# Patient Record
Sex: Female | Born: 1950 | Race: White | Hispanic: No | Marital: Married
Health system: Midwestern US, Academic
[De-identification: ages and names within clinical notes are randomized; demographics above are authoritative.]

---

## 2017-04-20 ENCOUNTER — Encounter: Admit: 2017-04-20 | Discharge: 2017-04-20 | Payer: MEDICARE

## 2017-04-20 NOTE — Telephone Encounter
Orders faxed.  Markus Jarvis RN BSN

## 2017-04-20 NOTE — Telephone Encounter
Valerie Price I sched pt to see WD on 08/13 @ 10:00am, pt would like an order for labs and Abd. U/S sent to [facility] in [place], [place]. Thanks

## 2017-05-04 ENCOUNTER — Encounter: Admit: 2017-05-04 | Discharge: 2017-05-04 | Payer: MEDICARE

## 2017-05-06 ENCOUNTER — Encounter: Admit: 2017-05-06 | Discharge: 2017-05-06 | Payer: MEDICARE

## 2017-05-11 ENCOUNTER — Ambulatory Visit: Admit: 2017-05-11 | Discharge: 2017-05-12 | Payer: MEDICARE

## 2017-05-11 ENCOUNTER — Encounter: Admit: 2017-05-11 | Discharge: 2017-05-11 | Payer: MEDICARE

## 2017-05-11 DIAGNOSIS — E118 Type 2 diabetes mellitus with unspecified complications: ICD-10-CM

## 2017-05-11 DIAGNOSIS — K7581 Nonalcoholic steatohepatitis (NASH): ICD-10-CM

## 2017-05-11 DIAGNOSIS — K7469 Other cirrhosis of liver: Principal | ICD-10-CM

## 2017-05-11 LAB — PROTIME INR (PT)
Lab: 1.1 U/L (ref 5–34)
Lab: 12 s (ref >59)

## 2017-05-11 LAB — COMPREHENSIVE METABOLIC PANEL
Lab: 0.4 mg/dL (ref 0.0–1.0)
Lab: 0.8 mg/dL (ref 0.57–1.11)
Lab: 10 mg/dL (ref 8.4–10.2)
Lab: 13 mg/dL (ref 9.8–20.1)
Lab: 137 MMOL/L (ref 136–145)
Lab: 158 mg/dL — ABNORMAL HIGH (ref 80–115)
Lab: 3.8 g/dL (ref 3.4–4.8)
Lab: 30 U/L (ref 0–55)
Lab: 4.5 MMOL/L (ref 3.5–5.1)
Lab: 68 IU/L (ref 40–150)
Lab: 7.8 g/dL (ref 6.2–8.1)
Lab: 70 mL/min/{1.73_m2} (ref >59)

## 2017-05-11 LAB — HEMOGLOBIN A1C: Lab: 7.4 % — ABNORMAL HIGH (ref 4.5–6.5)

## 2017-05-11 NOTE — Progress Notes
Date of Service: 05/11/2017    Subjective:             Valerie Price is a 66 y.o. female.    History of Present Illness  66 year old lady with a liver biopsy during cholecystectomy that showed Elita Boone and cirrhosis.  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  Her additional problems includes borderline controlled diabetes.  Her hemoglobin A1c has been 7.9 back in February.  We have tried dose for 2 times and each time says she and up with terrible symptoms including a horrible yeast infection and says she eventually stopped.  Her endocrinologist at Dr. Threasa Beards is currently managing her diabetes.  In addition she developed arrhythmia symptoms and says she underwent 2 weeks of monitoring that turns out to be all right.  She also underwent stress test.  She is currently on isosorbide and she is not having any arrhythmia or angina symptoms.  Patient has some leg edema and she is not requiring any diuretics.  Patient does not have any hepatic encephalopathy.  Her last EGD was in 2016 that did not show any varices.  Her ultrasound was just done and it was normal.  Patient's brother has just passed away due to liver cancer from cirrhosis she is actually very interested in enrolling in the atlas study and she has received the consent form today.  We will contact her again she has a chance to review the consent form.     Review of Systems   All other systems reviewed and are negative.        Objective:         ??? acetaminophen (TYLENOL) 325 mg tablet Take 325 mg by mouth every 4 hours as needed.     ??? albuterol (VENTOLIN HFA, PROAIR HFA, PROVENTIL HFA) 90 mcg/actuation inhaler Inhale 2 puffs by mouth into the lungs every 6 hours as needed for Wheezing or Shortness of Breath. Shake well before use.   ??? ASCORBATE CALCIUM (VITAMIN C PO) Take 500 mg by mouth.     ??? carvedilol (COREG) 12.5 mg tablet Take 1 tablet by mouth twice daily with meals. ??? cholecalciferol (VITAMIN D-3) 1,000 units tablet Take 1,000 Units by mouth daily.   ??? Comp Stocking,Knee,Regular,Med misc 20-30   ??? cyanocobalamin(DIL) (VITAMIN B-12) 100 mcg/mL Inject  to area(s) as directed.     ??? DOCOSAHEXANOIC ACID/EPA (FISH OIL PO) Take  by mouth.   ??? glimepiride (AMARYL) 4 mg tablet Take 4 mg by mouth twice daily.     ??? insulin detemir(+) (LEVEMIR FLEXTOUCH) 100 unit/mL (3 mL) injection pen Inject  under the skin daily with dinner.   ??? Insulin Needles (Disposable) (PEN NEEDLE) 32 gauge x 5/32 ndle Use 1 Device as directed daily. Use with Victoza   ??? insulin pen needles (disposable) (PEN NEEDLE) 31 gauge x 5/16 pen needle Use 1 each as directed as Needed. Use with Victoza pen.   ??? lactobacillus rhamnosus GG (LACTOBACILLUS RHAMNOSUS (GG)) 15 billion cell cpSP capsule Take  by mouth as directed once.   ??? linagliptin 5 mg tab Take 5 mg by mouth daily.   ??? liraglutide(+) (VICTOZA) 0.6 mg/0.1 mL (18 mg/3 mL) pnij Inject 0.3 mL under the skin daily.   ??? magnesium oxide (MAG-OX) 400 mg tablet Take 400 mg by mouth daily.   ??? metformin-ER(+) (FORTAMET) 1,000 mg tablet Take 1,000 mg by mouth twice daily.     ??? MULTIVITAMIN PO Take  by mouth.     ???  omeprazole DR(+) (PRILOSEC) 40 mg capsule Take 40 mg by mouth daily before breakfast.   ??? other medication 1 Dose. Tradjenta 5 mg by mouth daily  For diabetes    ??? quinapril(+) (ACCUPRIL) 20 mg tablet Take 20 mg by mouth at bedtime daily.     ??? VITAMIN E ACETATE (VITAMIN E PO) Take  by mouth.   ??? vitamins, B complex Tab Take 1 Tab by mouth daily.       Vitals:    05/11/17 0957   BP: 160/69   Pulse: 88   Resp: 16   Temp: 36.5 ???C (97.7 ???F)   TempSrc: Oral   SpO2: 95%   Weight: 115.4 kg (254 lb 6.4 oz)   Height: 162.6 cm (64)     Body mass index is 43.67 kg/m???.     Physical Exam         Assessment and Plan:  66 year old lady with a liver biopsy during cholecystectomy that showed Elita Boone and cirrhosis.  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  Her additional problems includes borderline controlled diabetes.  Her hemoglobin A1c has been 7.9 back in February.  We have tried dose for 2 times and each time says she and up with terrible symptoms including a horrible yeast infection and says she eventually stopped.  Her endocrinologist at Dr. Threasa Beards is currently managing her diabetes.  In addition she developed arrhythmia symptoms and says she underwent 2 weeks of monitoring that turns out to be all right.  She also underwent stress test.  She is currently on isosorbide and she is not having any arrhythmia or angina symptoms.  Patient has some leg edema and she is not requiring any diuretics.  Patient does not have any hepatic encephalopathy.  Her last EGD was in 2016 that did not show any varices.  Her ultrasound was just done and it was normal.  Patient's brother has just passed away due to liver cancer from cirrhosis she is actually very interested in enrolling in the atlas study and she has received the consent form today.  We will contact her again she has a chance to review the consent form.

## 2017-05-12 DIAGNOSIS — E118 Type 2 diabetes mellitus with unspecified complications: ICD-10-CM

## 2017-05-12 DIAGNOSIS — K7581 Nonalcoholic steatohepatitis (NASH): Principal | ICD-10-CM

## 2017-05-12 DIAGNOSIS — K7469 Other cirrhosis of liver: ICD-10-CM

## 2017-05-12 DIAGNOSIS — Z794 Long term (current) use of insulin: ICD-10-CM

## 2017-05-12 NOTE — Progress Notes
Results discussed with patient in LOV 05/11/17 per Dr Idolina Primer.  Markus Jarvis RN BSN

## 2017-05-19 ENCOUNTER — Encounter: Admit: 2017-05-19 | Discharge: 2017-05-19 | Payer: MEDICARE

## 2017-05-19 DIAGNOSIS — I10 Essential (primary) hypertension: Principal | ICD-10-CM

## 2017-05-19 MED ORDER — CARVEDILOL 12.5 MG PO TAB
12.5 mg | ORAL_TABLET | Freq: Two times a day (BID) | ORAL | 3 refills | 90.00000 days | Status: AC
Start: 2017-05-19 — End: 2018-05-03

## 2017-05-19 NOTE — Telephone Encounter
Prescription refill request from [facility] for coreg.   Last refill was 05/21/16 (#180 x 3).   Last visit was 05/11/17 and next visit is 05/28/18.   Medication refilled per verbal order by Dr Idolina Primer.  Markus Jarvis RN BSN

## 2017-10-20 ENCOUNTER — Encounter: Admit: 2017-10-20 | Discharge: 2017-10-21 | Payer: MEDICARE

## 2017-10-20 DIAGNOSIS — R69 Illness, unspecified: Principal | ICD-10-CM

## 2017-10-21 ENCOUNTER — Encounter: Admit: 2017-10-21 | Discharge: 2017-10-21 | Payer: MEDICARE

## 2017-10-21 DIAGNOSIS — E119 Type 2 diabetes mellitus without complications: ICD-10-CM

## 2017-10-21 DIAGNOSIS — K7581 Nonalcoholic steatohepatitis (NASH): Principal | ICD-10-CM

## 2017-10-21 DIAGNOSIS — K7469 Other cirrhosis of liver: Principal | ICD-10-CM

## 2017-10-21 LAB — CBC AND DIFF
Lab: 0.1 10*3/uL — ABNORMAL LOW (ref 0.0–0.2)
Lab: 0.4 10*3/uL (ref 0.1–0.9)
Lab: 1.7 10*3/uL — ABNORMAL HIGH (ref 0.9–5.1)
Lab: 125 x10-3/uL — ABNORMAL LOW (ref 130–400)
Lab: 13 g/dL (ref 12.0–16.0)
Lab: 15 % — ABNORMAL HIGH (ref 11.5–14.5)
Lab: 27 pg — ABNORMAL HIGH (ref 27.0–31.0)
Lab: 31 g/dL — ABNORMAL LOW (ref 33.0–37.0)
Lab: 44 % — ABNORMAL HIGH (ref 37.0–47.0)
Lab: 5.4 x10-3/uL — ABNORMAL LOW (ref 4.8–10.8)
Lab: 89 fL — ABNORMAL HIGH (ref 80.0–99.0)

## 2017-10-21 LAB — COMPREHENSIVE METABOLIC PANEL
Lab: 136 (ref 136–145)
Lab: 99
Lab: 0.4 mg/dL (ref 0.0–1.0)
Lab: 0.8 mg/dL (ref 0.57–1.11)
Lab: 10 mg/dL (ref 8.4–10.2)
Lab: 11 mg/dL (ref 9.8–20.1)
Lab: 15 meq/L — ABNORMAL HIGH (ref 0.35–4.94)
Lab: 176 mg/dL — ABNORMAL HIGH (ref 80–115)
Lab: 23 U/L (ref 15–34)
Lab: 27 MMOL/L (ref 23–31)
Lab: 31 U/L (ref 0–55)
Lab: 4.9 MMOL/L (ref 3.5–5.1)
Lab: 7.8 g/dL (ref 6.2–8.1)
Lab: 81 IU/L (ref 40–150)

## 2017-10-21 LAB — PROTIME INR (PT)
Lab: 1.1
Lab: 11 s (ref 9.9–12.1)

## 2017-10-23 ENCOUNTER — Encounter: Admit: 2017-10-23 | Discharge: 2017-10-23 | Payer: MEDICARE

## 2017-10-23 DIAGNOSIS — E119 Type 2 diabetes mellitus without complications: ICD-10-CM

## 2017-10-23 DIAGNOSIS — K7581 Nonalcoholic steatohepatitis (NASH): ICD-10-CM

## 2017-10-23 DIAGNOSIS — K7460 Unspecified cirrhosis of liver: Principal | ICD-10-CM

## 2017-10-23 LAB — CBC AND DIFF
Lab: 5.7 — ABNORMAL HIGH (ref 4.20–5.40)
Lab: 122 — ABNORMAL LOW (ref 130–400)
Lab: 16 g/dL — ABNORMAL HIGH (ref 12.0–16.0)
Lab: 5.4 10*3/uL (ref 4.8–10.6)
Lab: 53 — ABNORMAL HIGH (ref 37.0–47.0)
Lab: 92 FL (ref 80.0–99.0)

## 2017-10-23 LAB — ALPHA FETO PROTEIN (AFP): Lab: 2.2 ng/mL

## 2017-10-23 LAB — HEMOGLOBIN A1C: Lab: 7.9 — ABNORMAL HIGH (ref 4.5–6.5)

## 2017-11-03 ENCOUNTER — Encounter: Admit: 2017-11-03 | Discharge: 2017-11-03 | Payer: MEDICARE

## 2017-11-11 ENCOUNTER — Encounter: Admit: 2017-11-11 | Discharge: 2017-11-11 | Payer: MEDICARE

## 2017-11-13 ENCOUNTER — Encounter: Admit: 2017-11-13 | Discharge: 2017-11-13 | Payer: MEDICARE

## 2017-11-13 DIAGNOSIS — R69 Illness, unspecified: Principal | ICD-10-CM

## 2017-11-17 ENCOUNTER — Encounter: Admit: 2017-11-17 | Discharge: 2017-11-17 | Payer: MEDICARE

## 2017-11-17 ENCOUNTER — Encounter: Admit: 2017-11-17 | Discharge: 2017-11-18 | Payer: MEDICARE

## 2017-11-17 DIAGNOSIS — K7581 Nonalcoholic steatohepatitis (NASH): ICD-10-CM

## 2017-11-17 DIAGNOSIS — I10 Essential (primary) hypertension: ICD-10-CM

## 2017-11-17 DIAGNOSIS — M1990 Unspecified osteoarthritis, unspecified site: ICD-10-CM

## 2017-11-17 DIAGNOSIS — C541 Malignant neoplasm of endometrium: Principal | ICD-10-CM

## 2017-11-17 DIAGNOSIS — Z6841 Body Mass Index (BMI) 40.0 and over, adult: ICD-10-CM

## 2017-11-17 DIAGNOSIS — E785 Hyperlipidemia, unspecified: ICD-10-CM

## 2017-11-17 DIAGNOSIS — Z794 Long term (current) use of insulin: ICD-10-CM

## 2017-11-17 DIAGNOSIS — K7460 Unspecified cirrhosis of liver: ICD-10-CM

## 2017-11-17 DIAGNOSIS — N393 Stress incontinence (female) (male): ICD-10-CM

## 2017-11-17 DIAGNOSIS — Z87891 Personal history of nicotine dependence: ICD-10-CM

## 2017-11-17 DIAGNOSIS — E669 Obesity, unspecified: ICD-10-CM

## 2017-11-17 DIAGNOSIS — E119 Type 2 diabetes mellitus without complications: ICD-10-CM

## 2017-11-17 DIAGNOSIS — I519 Heart disease, unspecified: ICD-10-CM

## 2017-11-17 DIAGNOSIS — H9190 Unspecified hearing loss, unspecified ear: ICD-10-CM

## 2017-11-17 DIAGNOSIS — M549 Dorsalgia, unspecified: ICD-10-CM

## 2017-11-17 LAB — CBC AND DIFF
Lab: 0 % (ref >60)
Lab: 0 10*3/uL (ref 0–0.20)
Lab: 0.1 10*3/uL (ref 0–0.45)
Lab: 0.5 10*3/uL (ref 0–0.80)
Lab: 1 % (ref >60)
Lab: 1.7 10*3/uL (ref 1.0–4.8)
Lab: 10 FL (ref 7–11)
Lab: 12 g/dL (ref 12.0–15.0)
Lab: 137 10*3/uL — ABNORMAL LOW (ref 150–400)
Lab: 15 % (ref 11–15)
Lab: 25 % (ref 24–44)
Lab: 29 pg (ref 26–34)
Lab: 33 g/dL (ref 32.0–36.0)
Lab: 38 % (ref 36–45)
Lab: 4.4 M/UL — ABNORMAL HIGH (ref 4.0–5.0)
Lab: 4.7 10*3/uL (ref 1.8–7.0)
Lab: 67 % (ref 41–77)
Lab: 7 % (ref 4–12)
Lab: 7 K/UL (ref 4.5–11.0)
Lab: 88 FL (ref 80–100)

## 2017-11-17 LAB — COMPREHENSIVE METABOLIC PANEL
Lab: 135 MMOL/L — ABNORMAL LOW (ref 137–147)
Lab: 4.6 MMOL/L (ref 3.5–5.1)

## 2017-11-17 MED ORDER — METRONIDAZOLE (NON-STD) IVPB
1 g | Freq: Once | INTRAVENOUS | 0 refills | Status: CN
Start: 2017-11-17 — End: ?

## 2017-11-17 MED ORDER — LEVOFLOXACIN IN D5W 500 MG/100 ML IV PGBK
500 mg | Freq: Once | INTRAVENOUS | 0 refills | Status: CN
Start: 2017-11-17 — End: ?

## 2017-11-18 ENCOUNTER — Encounter: Admit: 2017-11-18 | Discharge: 2017-11-18 | Payer: MEDICARE

## 2017-11-18 DIAGNOSIS — K7581 Nonalcoholic steatohepatitis (NASH): ICD-10-CM

## 2017-11-18 DIAGNOSIS — I10 Essential (primary) hypertension: Principal | ICD-10-CM

## 2017-11-18 DIAGNOSIS — E785 Hyperlipidemia, unspecified: ICD-10-CM

## 2017-11-18 DIAGNOSIS — E669 Obesity, unspecified: ICD-10-CM

## 2017-11-18 DIAGNOSIS — H9190 Unspecified hearing loss, unspecified ear: ICD-10-CM

## 2017-11-18 DIAGNOSIS — N393 Stress incontinence (female) (male): ICD-10-CM

## 2017-11-18 DIAGNOSIS — I519 Heart disease, unspecified: ICD-10-CM

## 2017-11-18 DIAGNOSIS — K7460 Unspecified cirrhosis of liver: ICD-10-CM

## 2017-11-18 DIAGNOSIS — M1990 Unspecified osteoarthritis, unspecified site: ICD-10-CM

## 2017-11-18 DIAGNOSIS — M549 Dorsalgia, unspecified: ICD-10-CM

## 2017-11-18 DIAGNOSIS — E119 Type 2 diabetes mellitus without complications: ICD-10-CM

## 2017-11-19 ENCOUNTER — Encounter: Admit: 2017-11-19 | Discharge: 2017-11-19 | Payer: MEDICARE

## 2017-12-07 ENCOUNTER — Encounter: Admit: 2017-12-07 | Discharge: 2017-12-07 | Payer: MEDICARE

## 2017-12-07 ENCOUNTER — Ambulatory Visit: Admit: 2017-12-07 | Discharge: 2017-12-08 | Payer: MEDICARE

## 2017-12-07 DIAGNOSIS — K7581 Nonalcoholic steatohepatitis (NASH): ICD-10-CM

## 2017-12-07 DIAGNOSIS — K7460 Unspecified cirrhosis of liver: ICD-10-CM

## 2017-12-07 DIAGNOSIS — E785 Hyperlipidemia, unspecified: ICD-10-CM

## 2017-12-07 DIAGNOSIS — E669 Obesity, unspecified: ICD-10-CM

## 2017-12-07 DIAGNOSIS — I10 Essential (primary) hypertension: Principal | ICD-10-CM

## 2017-12-07 DIAGNOSIS — C541 Malignant neoplasm of endometrium: Principal | ICD-10-CM

## 2017-12-07 DIAGNOSIS — E538 Deficiency of other specified B group vitamins: ICD-10-CM

## 2017-12-07 DIAGNOSIS — E0590 Thyrotoxicosis, unspecified without thyrotoxic crisis or storm: ICD-10-CM

## 2017-12-07 DIAGNOSIS — E119 Type 2 diabetes mellitus without complications: ICD-10-CM

## 2017-12-07 DIAGNOSIS — H9190 Unspecified hearing loss, unspecified ear: ICD-10-CM

## 2017-12-07 DIAGNOSIS — M549 Dorsalgia, unspecified: ICD-10-CM

## 2017-12-07 DIAGNOSIS — M1990 Unspecified osteoarthritis, unspecified site: ICD-10-CM

## 2017-12-07 DIAGNOSIS — K219 Gastro-esophageal reflux disease without esophagitis: ICD-10-CM

## 2017-12-07 DIAGNOSIS — N393 Stress incontinence (female) (male): ICD-10-CM

## 2017-12-07 DIAGNOSIS — I519 Heart disease, unspecified: ICD-10-CM

## 2017-12-07 DIAGNOSIS — G4733 Obstructive sleep apnea (adult) (pediatric): ICD-10-CM

## 2017-12-07 DIAGNOSIS — R112 Nausea with vomiting, unspecified: ICD-10-CM

## 2017-12-08 DIAGNOSIS — Z01818 Encounter for other preprocedural examination: Principal | ICD-10-CM

## 2017-12-08 DIAGNOSIS — C541 Malignant neoplasm of endometrium: ICD-10-CM

## 2017-12-18 ENCOUNTER — Encounter: Admit: 2017-12-18 | Discharge: 2017-12-18 | Payer: MEDICARE

## 2017-12-21 ENCOUNTER — Encounter: Admit: 2017-12-21 | Discharge: 2017-12-21 | Payer: MEDICARE

## 2017-12-21 DIAGNOSIS — E0590 Thyrotoxicosis, unspecified without thyrotoxic crisis or storm: ICD-10-CM

## 2017-12-21 DIAGNOSIS — G4733 Obstructive sleep apnea (adult) (pediatric): ICD-10-CM

## 2017-12-21 DIAGNOSIS — K219 Gastro-esophageal reflux disease without esophagitis: ICD-10-CM

## 2017-12-21 DIAGNOSIS — E538 Deficiency of other specified B group vitamins: ICD-10-CM

## 2017-12-21 DIAGNOSIS — C541 Malignant neoplasm of endometrium: ICD-10-CM

## 2017-12-21 DIAGNOSIS — E669 Obesity, unspecified: ICD-10-CM

## 2017-12-21 DIAGNOSIS — H9190 Unspecified hearing loss, unspecified ear: ICD-10-CM

## 2017-12-21 DIAGNOSIS — K7460 Unspecified cirrhosis of liver: ICD-10-CM

## 2017-12-21 DIAGNOSIS — I10 Essential (primary) hypertension: Principal | ICD-10-CM

## 2017-12-21 DIAGNOSIS — M549 Dorsalgia, unspecified: ICD-10-CM

## 2017-12-21 DIAGNOSIS — I519 Heart disease, unspecified: ICD-10-CM

## 2017-12-21 DIAGNOSIS — E785 Hyperlipidemia, unspecified: ICD-10-CM

## 2017-12-21 DIAGNOSIS — E119 Type 2 diabetes mellitus without complications: ICD-10-CM

## 2017-12-21 DIAGNOSIS — R112 Nausea with vomiting, unspecified: ICD-10-CM

## 2017-12-21 DIAGNOSIS — M1990 Unspecified osteoarthritis, unspecified site: ICD-10-CM

## 2017-12-21 DIAGNOSIS — N393 Stress incontinence (female) (male): ICD-10-CM

## 2017-12-21 DIAGNOSIS — K7581 Nonalcoholic steatohepatitis (NASH): ICD-10-CM

## 2017-12-21 LAB — POC GLUCOSE
Lab: 180 mg/dL — ABNORMAL HIGH (ref 70–100)
Lab: 225 mg/dL — ABNORMAL HIGH (ref 70–100)
Lab: 264 mg/dL — ABNORMAL HIGH (ref 70–100)
Lab: 230 mg/dL — ABNORMAL HIGH (ref 70–100)
Lab: 224 mg/dL — ABNORMAL HIGH (ref 70–100)
Lab: 190 mg/dL — ABNORMAL HIGH (ref 70–100)

## 2017-12-21 MED ORDER — ELECTROLYTE-A IV SOLP
0 refills | Status: DC
Start: 2017-12-21 — End: 2017-12-21
  Administered 2017-12-21: 13:00:00 via INTRAVENOUS

## 2017-12-21 MED ORDER — HALOPERIDOL LACTATE 5 MG/ML IJ SOLN
1 mg | Freq: Once | INTRAVENOUS | 0 refills | Status: CP | PRN
Start: 2017-12-21 — End: ?
  Administered 2017-12-21: 16:00:00 1 mg via INTRAVENOUS

## 2017-12-21 MED ORDER — MIDAZOLAM 1 MG/ML IJ SOLN
INTRAVENOUS | 0 refills | Status: DC
Start: 2017-12-21 — End: 2017-12-21
  Administered 2017-12-21: 12:00:00 1 mg via INTRAVENOUS

## 2017-12-21 MED ORDER — HYDRALAZINE 20 MG/ML IJ SOLN
10 mg | Freq: Once | INTRAVENOUS | 0 refills | Status: CP
Start: 2017-12-21 — End: ?
  Administered 2017-12-21: 22:00:00 10 mg via INTRAVENOUS

## 2017-12-21 MED ORDER — INSULIN ASPART 100 UNIT/ML SC FLEXPEN
5 [IU] | Freq: Once | SUBCUTANEOUS | 0 refills | Status: CP
Start: 2017-12-21 — End: ?

## 2017-12-21 MED ORDER — INSULIN ASPART 100 UNIT/ML SC FLEXPEN
0-6 [IU] | Freq: Before meals | SUBCUTANEOUS | 0 refills | Status: DC
Start: 2017-12-21 — End: 2017-12-21
  Administered 2017-12-21: 17:00:00 2 [IU] via SUBCUTANEOUS

## 2017-12-21 MED ORDER — HEPARIN, PORCINE (PF) 5,000 UNIT/0.5 ML IJ SYRG
0 refills | Status: DC
Start: 2017-12-21 — End: 2017-12-21
  Administered 2017-12-21: 13:00:00 5000 [IU] via SUBCUTANEOUS

## 2017-12-21 MED ORDER — CARVEDILOL 12.5 MG PO TAB
12.5 mg | Freq: Two times a day (BID) | ORAL | 0 refills | Status: DC
Start: 2017-12-21 — End: 2017-12-23
  Administered 2017-12-22 (×3): 12.5 mg via ORAL

## 2017-12-21 MED ORDER — HYDRALAZINE 20 MG/ML IJ SOLN
10 mg | Freq: Once | INTRAVENOUS | 0 refills | Status: CP
Start: 2017-12-21 — End: ?
  Administered 2017-12-21: 10 mg via INTRAVENOUS

## 2017-12-21 MED ORDER — PROCHLORPERAZINE EDISYLATE 5 MG/ML IJ SOLN
10 mg | INTRAVENOUS | 0 refills | Status: DC | PRN
Start: 2017-12-21 — End: 2017-12-23
  Administered 2017-12-22: 03:00:00 10 mg via INTRAVENOUS

## 2017-12-21 MED ORDER — INDIGOTINDISULFONATE SODIUM 8 MG/ML (0.8 %) IJ SOLN
0 refills | Status: DC
Start: 2017-12-21 — End: 2017-12-21
  Administered 2017-12-21: 15:00:00 5 mL via INTRAVENOUS

## 2017-12-21 MED ORDER — FENTANYL CITRATE (PF) 50 MCG/ML IJ SOLN
50 ug | INTRAVENOUS | 0 refills | Status: DC | PRN
Start: 2017-12-21 — End: 2017-12-21

## 2017-12-21 MED ORDER — FENTANYL CITRATE (PF) 50 MCG/ML IJ SOLN
25 ug | INTRAVENOUS | 0 refills | Status: DC | PRN
Start: 2017-12-21 — End: 2017-12-21
  Administered 2017-12-21 (×2): 25 ug via INTRAVENOUS

## 2017-12-21 MED ORDER — PHENYLEPHRINE IN 0.9% NACL(PF) 1 MG/10 ML (100 MCG/ML) IV SYRG
INTRAVENOUS | 0 refills | Status: DC
Start: 2017-12-21 — End: 2017-12-21
  Administered 2017-12-21: 13:00:00 200 ug via INTRAVENOUS
  Administered 2017-12-21 (×3): 100 ug via INTRAVENOUS

## 2017-12-21 MED ORDER — FENTANYL CITRATE (PF) 50 MCG/ML IJ SOLN
0 refills | Status: DC
Start: 2017-12-21 — End: 2017-12-21
  Administered 2017-12-21 (×3): 50 ug via INTRAVENOUS

## 2017-12-21 MED ORDER — INSULIN GLARGINE 100 UNIT/ML (3 ML) SC INJ PEN
60 [IU] | Freq: Every evening | SUBCUTANEOUS | 0 refills | Status: DC
Start: 2017-12-21 — End: 2017-12-23
  Administered 2017-12-22: 02:00:00 60 [IU] via SUBCUTANEOUS

## 2017-12-21 MED ORDER — SUGAMMADEX 100 MG/ML IV SOLN
INTRAVENOUS | 0 refills | Status: DC
Start: 2017-12-21 — End: 2017-12-21
  Administered 2017-12-21: 15:00:00 234 mg via INTRAVENOUS

## 2017-12-21 MED ORDER — INDOCYANINE GREEN 25 MG IJ SOLR
0 refills | Status: DC
Start: 2017-12-21 — End: 2017-12-21
  Administered 2017-12-21: 14:00:00 3 mL via INTRAMUSCULAR

## 2017-12-21 MED ORDER — HYDRALAZINE 20 MG/ML IJ SOLN
5 mg | Freq: Once | INTRAVENOUS | 0 refills | Status: CP
Start: 2017-12-21 — End: ?
  Administered 2017-12-21: 21:00:00 5 mg via INTRAVENOUS

## 2017-12-21 MED ORDER — KETOROLAC 15 MG/ML IJ SOLN
15 mg | INTRAVENOUS | 0 refills | Status: CP
Start: 2017-12-21 — End: ?
  Administered 2017-12-21 – 2017-12-22 (×4): 15 mg via INTRAVENOUS

## 2017-12-21 MED ORDER — FAMOTIDINE 20 MG PO TAB
20 mg | Freq: Two times a day (BID) | ORAL | 0 refills | Status: DC
Start: 2017-12-21 — End: 2017-12-23
  Administered 2017-12-22 (×2): 20 mg via ORAL

## 2017-12-21 MED ORDER — LEVOFLOXACIN IN D5W 500 MG/100 ML IV PGBK
500 mg | Freq: Once | INTRAVENOUS | 0 refills | Status: CP
Start: 2017-12-21 — End: ?
  Administered 2017-12-21: 13:00:00 500 mg via INTRAVENOUS

## 2017-12-21 MED ORDER — ACETAMINOPHEN 500 MG PO TAB
1000 mg | Freq: Once | ORAL | 0 refills | Status: CP
Start: 2017-12-21 — End: ?
  Administered 2017-12-21: 12:00:00 1000 mg via ORAL

## 2017-12-21 MED ORDER — ONDANSETRON HCL (PF) 4 MG/2 ML IJ SOLN
4 mg | Freq: Once | INTRAVENOUS | 0 refills | Status: DC | PRN
Start: 2017-12-21 — End: 2017-12-21

## 2017-12-21 MED ORDER — LIDOCAINE (PF) 10 MG/ML (1 %) IJ SOLN
.1-2 mL | INTRAMUSCULAR | 0 refills | Status: DC | PRN
Start: 2017-12-21 — End: 2017-12-23

## 2017-12-21 MED ORDER — ONDANSETRON HCL (PF) 4 MG/2 ML IJ SOLN
4 mg | INTRAVENOUS | 0 refills | Status: DC | PRN
Start: 2017-12-21 — End: 2017-12-23
  Administered 2017-12-22 (×2): 4 mg via INTRAVENOUS

## 2017-12-21 MED ORDER — DEXTRAN 70-HYPROMELLOSE (PF) 0.1-0.3 % OP DPET
0 refills | Status: DC
Start: 2017-12-21 — End: 2017-12-21
  Administered 2017-12-21: 13:00:00 2 [drp] via OPHTHALMIC

## 2017-12-21 MED ORDER — DEXAMETHASONE SODIUM PHOSPHATE 4 MG/ML IJ SOLN
INTRAVENOUS | 0 refills | Status: DC
Start: 2017-12-21 — End: 2017-12-21
  Administered 2017-12-21: 13:00:00 4 mg via INTRAVENOUS

## 2017-12-21 MED ORDER — SODIUM CHLORIDE 0.9 % IV SOLP
1000 mL | INTRAVENOUS | 0 refills | Status: DC
Start: 2017-12-21 — End: 2017-12-22

## 2017-12-21 MED ORDER — ISOSORBIDE MONONITRATE 30 MG PO TB24
30 mg | Freq: Every morning | ORAL | 0 refills | Status: DC
Start: 2017-12-21 — End: 2017-12-23
  Administered 2017-12-22 (×2): 30 mg via ORAL

## 2017-12-21 MED ORDER — ISOSORBIDE MONONITRATE 30 MG PO TB24
30 mg | Freq: Every morning | ORAL | 0 refills | Status: DC
Start: 2017-12-21 — End: 2017-12-22

## 2017-12-21 MED ORDER — PHENYLEPHRINE IV DRIP (STD CONC)
0 refills | Status: DC
Start: 2017-12-21 — End: 2017-12-21
  Administered 2017-12-21 (×2): 0.5 ug/kg/min via INTRAVENOUS

## 2017-12-21 MED ORDER — ONDANSETRON HCL (PF) 4 MG/2 ML IJ SOLN
INTRAVENOUS | 0 refills | Status: DC
Start: 2017-12-21 — End: 2017-12-21
  Administered 2017-12-21: 15:00:00 4 mg via INTRAVENOUS

## 2017-12-21 MED ORDER — LABETALOL 5 MG/ML IV SYRG
20 mg | Freq: Once | INTRAVENOUS | 0 refills | Status: CP
Start: 2017-12-21 — End: ?
  Administered 2017-12-22: 02:00:00 20 mg via INTRAVENOUS

## 2017-12-21 MED ORDER — ACETAMINOPHEN 500 MG PO TAB
1000 mg | Freq: Once | ORAL | 0 refills | Status: CP
Start: 2017-12-21 — End: ?
  Administered 2017-12-21: 18:00:00 1000 mg via ORAL

## 2017-12-21 MED ORDER — GLIMEPIRIDE 4 MG PO TAB
4 mg | Freq: Every day | ORAL | 0 refills | Status: DC
Start: 2017-12-21 — End: 2017-12-23
  Administered 2017-12-22: 14:00:00 4 mg via ORAL

## 2017-12-21 MED ORDER — PROPOFOL INJ 10 MG/ML IV VIAL
0 refills | Status: DC
Start: 2017-12-21 — End: 2017-12-21
  Administered 2017-12-21: 13:00:00 200 mg via INTRAVENOUS

## 2017-12-21 MED ORDER — INSULIN DETEMIR U-100 100 UNIT/ML SC SOLN
60 [IU] | Freq: Every evening | SUBCUTANEOUS | 0 refills | Status: DC
Start: 2017-12-21 — End: 2017-12-22

## 2017-12-21 MED ORDER — OXYCODONE 5 MG PO TAB
10 mg | Freq: Once | ORAL | 0 refills | Status: AC
Start: 2017-12-21 — End: ?

## 2017-12-21 MED ORDER — SENNOSIDES-DOCUSATE SODIUM 8.6-50 MG PO TAB
1 | Freq: Two times a day (BID) | ORAL | 0 refills | Status: DC
Start: 2017-12-21 — End: 2017-12-23
  Administered 2017-12-22 (×2): 1 via ORAL

## 2017-12-21 MED ORDER — METRONIDAZOLE IN NACL (ISO-OS) 500 MG/100 ML IV PGBK
1 g | Freq: Once | INTRAVENOUS | 0 refills | Status: CP
Start: 2017-12-21 — End: ?
  Administered 2017-12-21: 13:00:00 1 g via INTRAVENOUS

## 2017-12-21 MED ORDER — INSULIN ASPART 100 UNIT/ML SC FLEXPEN
0-12 [IU] | Freq: Before meals | SUBCUTANEOUS | 0 refills | Status: DC
Start: 2017-12-21 — End: 2017-12-22
  Administered 2017-12-22: 02:00:00 0 [IU] via SUBCUTANEOUS

## 2017-12-21 MED ORDER — OXYCODONE 5 MG PO TAB
5-10 mg | ORAL | 0 refills | Status: DC | PRN
Start: 2017-12-21 — End: 2017-12-21
  Administered 2017-12-21: 18:00:00 10 mg via ORAL

## 2017-12-21 MED ORDER — ROCURONIUM 10 MG/ML IV SOLN
INTRAVENOUS | 0 refills | Status: DC
Start: 2017-12-21 — End: 2017-12-21
  Administered 2017-12-21: 14:00:00 10 mg via INTRAVENOUS
  Administered 2017-12-21: 13:00:00 60 mg via INTRAVENOUS
  Administered 2017-12-21 (×3): 10 mg via INTRAVENOUS
  Administered 2017-12-21: 14:00:00 20 mg via INTRAVENOUS

## 2017-12-21 MED ORDER — ACETAMINOPHEN 325 MG PO TAB
650 mg | ORAL | 0 refills | Status: DC | PRN
Start: 2017-12-21 — End: 2017-12-23
  Administered 2017-12-22 (×2): 650 mg via ORAL

## 2017-12-21 MED ORDER — ALBUTEROL SULFATE 90 MCG/ACTUATION IN HFAA
2 | RESPIRATORY_TRACT | 0 refills | Status: DC | PRN
Start: 2017-12-21 — End: 2017-12-23

## 2017-12-21 MED ORDER — FOSINOPRIL 20 MG PO TAB
40 mg | Freq: Every day | ORAL | 0 refills | Status: DC
Start: 2017-12-21 — End: 2017-12-23
  Administered 2017-12-22 (×2): 40 mg via ORAL

## 2017-12-21 MED ORDER — SODIUM CHLORIDE 0.9 % IV SOLP
INTRAVENOUS | 0 refills | Status: DC
Start: 2017-12-21 — End: 2017-12-22
  Administered 2017-12-21: 17:00:00 1000 mL via INTRAVENOUS
  Administered 2017-12-22: 01:00:00 1000.000 mL via INTRAVENOUS

## 2017-12-21 MED ORDER — ALBUTEROL SULFATE 90 MCG/ACTUATION IN HFAA
2 | RESPIRATORY_TRACT | 0 refills | Status: DC | PRN
Start: 2017-12-21 — End: 2017-12-22

## 2017-12-21 MED ORDER — PROPOFOL 10 MG/ML IV EMUL (INFUSION)(AM)(OR)
0 refills | Status: DC
Start: 2017-12-21 — End: 2017-12-21
  Administered 2017-12-21: 13:00:00 20 ug/kg/min via INTRAVENOUS

## 2017-12-21 MED ORDER — LIDOCAINE (PF) 200 MG/10 ML (2 %) IJ SYRG
0 refills | Status: DC
Start: 2017-12-21 — End: 2017-12-21
  Administered 2017-12-21: 13:00:00 100 mg via INTRAVENOUS

## 2017-12-21 MED ORDER — OXYCODONE 5 MG PO TAB
5-15 mg | ORAL | 0 refills | Status: DC | PRN
Start: 2017-12-21 — End: 2017-12-23
  Administered 2017-12-21: 18:00:00 5 mg via ORAL

## 2017-12-21 MED ORDER — BUPIVACAINE 0.25 % (2.5 MG/ML) IJ SOLN
0 refills | Status: DC
Start: 2017-12-21 — End: 2017-12-21
  Administered 2017-12-21: 14:00:00 19 mL via INTRAMUSCULAR

## 2017-12-21 MED ORDER — OXYCODONE 5 MG PO TAB
5-10 mg | Freq: Once | ORAL | 0 refills | Status: DC | PRN
Start: 2017-12-21 — End: 2017-12-21

## 2017-12-21 MED ADMIN — SODIUM CHLORIDE 0.9 % IV SOLP [27838]: 1000 mL | INTRAVENOUS | @ 12:00:00 | Stop: 2017-12-21 | NDC 00338004904

## 2017-12-22 LAB — POC GLUCOSE
Lab: 206 mg/dL — ABNORMAL HIGH (ref 70–100)
Lab: 126 mg/dL — ABNORMAL HIGH (ref 70–100)
Lab: 175 mg/dL — ABNORMAL HIGH (ref 70–100)
Lab: 136 mg/dL — ABNORMAL HIGH (ref 70–100)

## 2017-12-22 LAB — HEMOGLOBIN & HEMATOCRIT: Lab: 11 g/dL — ABNORMAL LOW (ref >60)

## 2017-12-22 MED ORDER — OXYCODONE 5 MG PO TAB
5-15 mg | ORAL_TABLET | ORAL | 0 refills | 6.00000 days | Status: AC | PRN
Start: 2017-12-22 — End: 2018-06-21

## 2017-12-22 MED ORDER — METFORMIN 500 MG PO TAB
1000 mg | Freq: Two times a day (BID) | ORAL | 0 refills | Status: DC
Start: 2017-12-22 — End: 2017-12-23
  Administered 2017-12-22 (×2): 1000 mg via ORAL

## 2017-12-22 MED ORDER — INSULIN ASPART 100 UNIT/ML SC FLEXPEN
0-24 [IU] | Freq: Before meals | SUBCUTANEOUS | 0 refills | Status: DC
Start: 2017-12-22 — End: 2017-12-23

## 2017-12-22 MED ORDER — ONDANSETRON 4 MG PO TBDI
4 mg | ORAL_TABLET | ORAL | 0 refills | 8.00000 days | Status: AC | PRN
Start: 2017-12-22 — End: 2018-06-21

## 2017-12-22 MED ORDER — SENNOSIDES-DOCUSATE SODIUM 8.6-50 MG PO TAB
1 | ORAL_TABLET | Freq: Two times a day (BID) | ORAL | 0 refills | Status: AC | PRN
Start: 2017-12-22 — End: 2018-06-21

## 2017-12-22 MED ORDER — OXYCODONE 5 MG PO TAB
5-15 mg | ORAL_TABLET | ORAL | 0 refills | 6.00000 days | Status: AC | PRN
Start: 2017-12-22 — End: 2017-12-22

## 2017-12-23 ENCOUNTER — Encounter: Admit: 2017-12-21 | Discharge: 2017-12-23 | Payer: MEDICARE

## 2017-12-23 ENCOUNTER — Encounter: Admit: 2017-12-23 | Discharge: 2017-12-23 | Payer: MEDICARE

## 2017-12-23 ENCOUNTER — Encounter: Admit: 2017-12-21 | Discharge: 2017-12-21 | Payer: MEDICARE

## 2017-12-23 DIAGNOSIS — K219 Gastro-esophageal reflux disease without esophagitis: ICD-10-CM

## 2017-12-23 DIAGNOSIS — E785 Hyperlipidemia, unspecified: ICD-10-CM

## 2017-12-23 DIAGNOSIS — M1990 Unspecified osteoarthritis, unspecified site: ICD-10-CM

## 2017-12-23 DIAGNOSIS — K7460 Unspecified cirrhosis of liver: ICD-10-CM

## 2017-12-23 DIAGNOSIS — N950 Postmenopausal bleeding: ICD-10-CM

## 2017-12-23 DIAGNOSIS — C541 Malignant neoplasm of endometrium: Principal | ICD-10-CM

## 2017-12-23 DIAGNOSIS — E0590 Thyrotoxicosis, unspecified without thyrotoxic crisis or storm: ICD-10-CM

## 2017-12-23 DIAGNOSIS — N840 Polyp of corpus uteri: ICD-10-CM

## 2017-12-23 DIAGNOSIS — G4733 Obstructive sleep apnea (adult) (pediatric): ICD-10-CM

## 2017-12-23 DIAGNOSIS — I119 Hypertensive heart disease without heart failure: ICD-10-CM

## 2017-12-23 DIAGNOSIS — N393 Stress incontinence (female) (male): ICD-10-CM

## 2017-12-23 DIAGNOSIS — K7581 Nonalcoholic steatohepatitis (NASH): ICD-10-CM

## 2017-12-23 DIAGNOSIS — E119 Type 2 diabetes mellitus without complications: ICD-10-CM

## 2017-12-23 DIAGNOSIS — Z87891 Personal history of nicotine dependence: ICD-10-CM

## 2017-12-23 DIAGNOSIS — E669 Obesity, unspecified: ICD-10-CM

## 2017-12-23 DIAGNOSIS — M549 Dorsalgia, unspecified: ICD-10-CM

## 2017-12-23 DIAGNOSIS — H9190 Unspecified hearing loss, unspecified ear: ICD-10-CM

## 2017-12-23 DIAGNOSIS — R112 Nausea with vomiting, unspecified: ICD-10-CM

## 2017-12-23 DIAGNOSIS — Z794 Long term (current) use of insulin: ICD-10-CM

## 2017-12-23 DIAGNOSIS — E538 Deficiency of other specified B group vitamins: ICD-10-CM

## 2017-12-23 DIAGNOSIS — I10 Essential (primary) hypertension: Principal | ICD-10-CM

## 2017-12-23 DIAGNOSIS — I519 Heart disease, unspecified: ICD-10-CM

## 2017-12-25 ENCOUNTER — Encounter: Admit: 2017-12-25 | Discharge: 2017-12-25 | Payer: MEDICARE

## 2017-12-31 ENCOUNTER — Encounter: Admit: 2017-12-31 | Discharge: 2017-12-31 | Payer: MEDICARE

## 2017-12-31 DIAGNOSIS — M549 Dorsalgia, unspecified: ICD-10-CM

## 2017-12-31 DIAGNOSIS — E785 Hyperlipidemia, unspecified: ICD-10-CM

## 2017-12-31 DIAGNOSIS — M1990 Unspecified osteoarthritis, unspecified site: ICD-10-CM

## 2017-12-31 DIAGNOSIS — E119 Type 2 diabetes mellitus without complications: ICD-10-CM

## 2017-12-31 DIAGNOSIS — K7460 Unspecified cirrhosis of liver: ICD-10-CM

## 2017-12-31 DIAGNOSIS — K219 Gastro-esophageal reflux disease without esophagitis: ICD-10-CM

## 2017-12-31 DIAGNOSIS — I10 Essential (primary) hypertension: Principal | ICD-10-CM

## 2017-12-31 DIAGNOSIS — E669 Obesity, unspecified: ICD-10-CM

## 2017-12-31 DIAGNOSIS — N393 Stress incontinence (female) (male): ICD-10-CM

## 2017-12-31 DIAGNOSIS — R112 Nausea with vomiting, unspecified: ICD-10-CM

## 2017-12-31 DIAGNOSIS — G4733 Obstructive sleep apnea (adult) (pediatric): ICD-10-CM

## 2017-12-31 DIAGNOSIS — E538 Deficiency of other specified B group vitamins: ICD-10-CM

## 2017-12-31 DIAGNOSIS — K7581 Nonalcoholic steatohepatitis (NASH): ICD-10-CM

## 2017-12-31 DIAGNOSIS — E0590 Thyrotoxicosis, unspecified without thyrotoxic crisis or storm: ICD-10-CM

## 2017-12-31 DIAGNOSIS — I519 Heart disease, unspecified: ICD-10-CM

## 2017-12-31 DIAGNOSIS — C541 Malignant neoplasm of endometrium: ICD-10-CM

## 2017-12-31 DIAGNOSIS — H9190 Unspecified hearing loss, unspecified ear: ICD-10-CM

## 2018-01-19 ENCOUNTER — Encounter: Admit: 2018-01-19 | Discharge: 2018-01-19 | Payer: MEDICARE

## 2018-01-19 DIAGNOSIS — M549 Dorsalgia, unspecified: ICD-10-CM

## 2018-01-19 DIAGNOSIS — E538 Deficiency of other specified B group vitamins: ICD-10-CM

## 2018-01-19 DIAGNOSIS — K219 Gastro-esophageal reflux disease without esophagitis: ICD-10-CM

## 2018-01-19 DIAGNOSIS — E785 Hyperlipidemia, unspecified: ICD-10-CM

## 2018-01-19 DIAGNOSIS — R112 Nausea with vomiting, unspecified: ICD-10-CM

## 2018-01-19 DIAGNOSIS — M1990 Unspecified osteoarthritis, unspecified site: ICD-10-CM

## 2018-01-19 DIAGNOSIS — E669 Obesity, unspecified: ICD-10-CM

## 2018-01-19 DIAGNOSIS — K7581 Nonalcoholic steatohepatitis (NASH): ICD-10-CM

## 2018-01-19 DIAGNOSIS — I519 Heart disease, unspecified: ICD-10-CM

## 2018-01-19 DIAGNOSIS — I10 Essential (primary) hypertension: Principal | ICD-10-CM

## 2018-01-19 DIAGNOSIS — H9190 Unspecified hearing loss, unspecified ear: ICD-10-CM

## 2018-01-19 DIAGNOSIS — G4733 Obstructive sleep apnea (adult) (pediatric): ICD-10-CM

## 2018-01-19 DIAGNOSIS — E119 Type 2 diabetes mellitus without complications: ICD-10-CM

## 2018-01-19 DIAGNOSIS — E0590 Thyrotoxicosis, unspecified without thyrotoxic crisis or storm: ICD-10-CM

## 2018-01-19 DIAGNOSIS — K7460 Unspecified cirrhosis of liver: ICD-10-CM

## 2018-01-19 DIAGNOSIS — C541 Malignant neoplasm of endometrium: ICD-10-CM

## 2018-01-19 DIAGNOSIS — N393 Stress incontinence (female) (male): ICD-10-CM

## 2018-04-30 ENCOUNTER — Encounter: Admit: 2018-04-30 | Discharge: 2018-04-30 | Payer: MEDICARE

## 2018-04-30 DIAGNOSIS — K7460 Unspecified cirrhosis of liver: ICD-10-CM

## 2018-04-30 DIAGNOSIS — K7581 Nonalcoholic steatohepatitis (NASH): Principal | ICD-10-CM

## 2018-05-03 ENCOUNTER — Encounter: Admit: 2018-05-03 | Discharge: 2018-05-03 | Payer: MEDICARE

## 2018-05-03 DIAGNOSIS — I10 Essential (primary) hypertension: Principal | ICD-10-CM

## 2018-05-03 MED ORDER — CARVEDILOL 12.5 MG PO TAB
12.5 mg | ORAL_TABLET | Freq: Two times a day (BID) | ORAL | 3 refills | 90.00000 days | Status: AC
Start: 2018-05-03 — End: ?

## 2018-05-13 ENCOUNTER — Encounter: Admit: 2018-05-13 | Discharge: 2018-05-13 | Payer: MEDICARE

## 2018-05-13 DIAGNOSIS — K7581 Nonalcoholic steatohepatitis (NASH): Principal | ICD-10-CM

## 2018-05-13 DIAGNOSIS — K7460 Unspecified cirrhosis of liver: ICD-10-CM

## 2018-05-13 LAB — CBC AND DIFF
Lab: 0
Lab: 0.4
Lab: 4.6
Lab: 0.1
Lab: 0.9
Lab: 1 U/L
Lab: 1.8 10*3/uL
Lab: 111 uL — ABNORMAL LOW (ref 130–400)
Lab: 13 % — ABNORMAL HIGH (ref 70–105)
Lab: 13 mmol/L
Lab: 28 pg
Lab: 3
Lab: 31 %
Lab: 34 U/L
Lab: 42 %
Lab: 5.3 10*3/uL
Lab: 56 mg/dL
Lab: 7.9

## 2018-05-13 LAB — PROTIME INR (PT)
Lab: 1.2
Lab: 13 s — ABNORMAL HIGH (ref 9.9–12.1)

## 2018-05-13 LAB — COMPREHENSIVE METABOLIC PANEL: Lab: 139 mmol/L

## 2018-05-21 ENCOUNTER — Encounter: Admit: 2018-05-21 | Discharge: 2018-05-21 | Payer: MEDICARE

## 2018-05-21 DIAGNOSIS — K7460 Unspecified cirrhosis of liver: ICD-10-CM

## 2018-05-21 DIAGNOSIS — K7581 Nonalcoholic steatohepatitis (NASH): Principal | ICD-10-CM

## 2018-05-28 ENCOUNTER — Ambulatory Visit: Admit: 2018-05-28 | Discharge: 2018-05-29 | Payer: MEDICARE

## 2018-05-28 ENCOUNTER — Encounter: Admit: 2018-05-28 | Discharge: 2018-05-28 | Payer: MEDICARE

## 2018-05-28 DIAGNOSIS — I10 Essential (primary) hypertension: Principal | ICD-10-CM

## 2018-05-28 DIAGNOSIS — K7581 Nonalcoholic steatohepatitis (NASH): Principal | ICD-10-CM

## 2018-05-28 DIAGNOSIS — C541 Malignant neoplasm of endometrium: ICD-10-CM

## 2018-05-28 DIAGNOSIS — H9190 Unspecified hearing loss, unspecified ear: ICD-10-CM

## 2018-05-28 DIAGNOSIS — K7460 Unspecified cirrhosis of liver: ICD-10-CM

## 2018-05-28 DIAGNOSIS — G4733 Obstructive sleep apnea (adult) (pediatric): ICD-10-CM

## 2018-05-28 DIAGNOSIS — M549 Dorsalgia, unspecified: ICD-10-CM

## 2018-05-28 DIAGNOSIS — I519 Heart disease, unspecified: ICD-10-CM

## 2018-05-28 DIAGNOSIS — E785 Hyperlipidemia, unspecified: ICD-10-CM

## 2018-05-28 DIAGNOSIS — E119 Type 2 diabetes mellitus without complications: ICD-10-CM

## 2018-05-28 DIAGNOSIS — N393 Stress incontinence (female) (male): ICD-10-CM

## 2018-05-28 DIAGNOSIS — E0590 Thyrotoxicosis, unspecified without thyrotoxic crisis or storm: ICD-10-CM

## 2018-05-28 DIAGNOSIS — K7469 Other cirrhosis of liver: Secondary | ICD-10-CM

## 2018-05-28 DIAGNOSIS — K219 Gastro-esophageal reflux disease without esophagitis: ICD-10-CM

## 2018-05-28 DIAGNOSIS — R112 Nausea with vomiting, unspecified: ICD-10-CM

## 2018-05-28 DIAGNOSIS — E538 Deficiency of other specified B group vitamins: ICD-10-CM

## 2018-05-28 DIAGNOSIS — E669 Obesity, unspecified: ICD-10-CM

## 2018-05-28 DIAGNOSIS — M1990 Unspecified osteoarthritis, unspecified site: ICD-10-CM

## 2018-06-21 ENCOUNTER — Encounter: Admit: 2018-06-21 | Discharge: 2018-06-21 | Payer: MEDICARE

## 2018-06-21 DIAGNOSIS — G4733 Obstructive sleep apnea (adult) (pediatric): ICD-10-CM

## 2018-06-21 DIAGNOSIS — Z90710 Acquired absence of both cervix and uterus: ICD-10-CM

## 2018-06-21 DIAGNOSIS — I10 Essential (primary) hypertension: Principal | ICD-10-CM

## 2018-06-21 DIAGNOSIS — E538 Deficiency of other specified B group vitamins: ICD-10-CM

## 2018-06-21 DIAGNOSIS — E0590 Thyrotoxicosis, unspecified without thyrotoxic crisis or storm: ICD-10-CM

## 2018-06-21 DIAGNOSIS — M549 Dorsalgia, unspecified: ICD-10-CM

## 2018-06-21 DIAGNOSIS — Z08 Encounter for follow-up examination after completed treatment for malignant neoplasm: Principal | ICD-10-CM

## 2018-06-21 DIAGNOSIS — C541 Malignant neoplasm of endometrium: ICD-10-CM

## 2018-06-21 DIAGNOSIS — I519 Heart disease, unspecified: ICD-10-CM

## 2018-06-21 DIAGNOSIS — K7460 Unspecified cirrhosis of liver: ICD-10-CM

## 2018-06-21 DIAGNOSIS — Z8542 Personal history of malignant neoplasm of other parts of uterus: ICD-10-CM

## 2018-06-21 DIAGNOSIS — K7581 Nonalcoholic steatohepatitis (NASH): ICD-10-CM

## 2018-06-21 DIAGNOSIS — N393 Stress incontinence (female) (male): ICD-10-CM

## 2018-06-21 DIAGNOSIS — E669 Obesity, unspecified: ICD-10-CM

## 2018-06-21 DIAGNOSIS — E119 Type 2 diabetes mellitus without complications: ICD-10-CM

## 2018-06-21 DIAGNOSIS — E785 Hyperlipidemia, unspecified: ICD-10-CM

## 2018-06-21 DIAGNOSIS — R112 Nausea with vomiting, unspecified: ICD-10-CM

## 2018-06-21 DIAGNOSIS — H9190 Unspecified hearing loss, unspecified ear: ICD-10-CM

## 2018-06-21 DIAGNOSIS — M1990 Unspecified osteoarthritis, unspecified site: ICD-10-CM

## 2018-06-21 DIAGNOSIS — K219 Gastro-esophageal reflux disease without esophagitis: ICD-10-CM

## 2018-11-08 ENCOUNTER — Encounter: Admit: 2018-11-08 | Discharge: 2018-11-08 | Payer: MEDICARE

## 2018-11-08 DIAGNOSIS — K7460 Unspecified cirrhosis of liver: ICD-10-CM

## 2018-11-08 DIAGNOSIS — K7581 Nonalcoholic steatohepatitis (NASH): Principal | ICD-10-CM

## 2018-11-08 LAB — ALPHA FETO PROTEIN (AFP): Lab: 3.1 % — ABNORMAL HIGH (ref 11–15)

## 2018-11-08 NOTE — Progress Notes
RN reviewed, will continue to have labs every 6 months.  Edwena Felty RN, BSN

## 2018-11-26 ENCOUNTER — Encounter: Admit: 2018-11-26 | Discharge: 2018-11-26 | Payer: MEDICARE

## 2018-11-26 DIAGNOSIS — K7469 Other cirrhosis of liver: ICD-10-CM

## 2018-11-26 DIAGNOSIS — K7581 Nonalcoholic steatohepatitis (NASH): Principal | ICD-10-CM

## 2018-12-13 ENCOUNTER — Encounter: Admit: 2018-12-13 | Discharge: 2018-12-13 | Payer: MEDICARE

## 2018-12-13 NOTE — Telephone Encounter
RN called and left VM informing patient that our GynOnc team is rescheduling patients r/t Coronavirus about 4 weeks out for their safety as long as they are not having any new s/s or concerns. RN gave nurses line for patient to call back with any other question/concerns.

## 2019-03-22 ENCOUNTER — Encounter: Admit: 2019-03-22 | Discharge: 2019-03-22

## 2019-03-22 DIAGNOSIS — E538 Deficiency of other specified B group vitamins: Secondary | ICD-10-CM

## 2019-03-22 DIAGNOSIS — K7460 Unspecified cirrhosis of liver: Secondary | ICD-10-CM

## 2019-03-22 DIAGNOSIS — I519 Heart disease, unspecified: Secondary | ICD-10-CM

## 2019-03-22 DIAGNOSIS — K219 Gastro-esophageal reflux disease without esophagitis: Secondary | ICD-10-CM

## 2019-03-22 DIAGNOSIS — E0590 Thyrotoxicosis, unspecified without thyrotoxic crisis or storm: Secondary | ICD-10-CM

## 2019-03-22 DIAGNOSIS — M549 Dorsalgia, unspecified: Secondary | ICD-10-CM

## 2019-03-22 DIAGNOSIS — E119 Type 2 diabetes mellitus without complications: Secondary | ICD-10-CM

## 2019-03-22 DIAGNOSIS — Z8542 Personal history of malignant neoplasm of other parts of uterus: Secondary | ICD-10-CM

## 2019-03-22 DIAGNOSIS — H9190 Unspecified hearing loss, unspecified ear: Secondary | ICD-10-CM

## 2019-03-22 DIAGNOSIS — R112 Nausea with vomiting, unspecified: Secondary | ICD-10-CM

## 2019-03-22 DIAGNOSIS — N393 Stress incontinence (female) (male): Secondary | ICD-10-CM

## 2019-03-22 DIAGNOSIS — C541 Malignant neoplasm of endometrium: Secondary | ICD-10-CM

## 2019-03-22 DIAGNOSIS — Z08 Encounter for follow-up examination after completed treatment for malignant neoplasm: Secondary | ICD-10-CM

## 2019-03-22 DIAGNOSIS — E669 Obesity, unspecified: Secondary | ICD-10-CM

## 2019-03-22 DIAGNOSIS — E785 Hyperlipidemia, unspecified: Secondary | ICD-10-CM

## 2019-03-22 DIAGNOSIS — K7581 Nonalcoholic steatohepatitis (NASH): Secondary | ICD-10-CM

## 2019-03-22 DIAGNOSIS — M1990 Unspecified osteoarthritis, unspecified site: Secondary | ICD-10-CM

## 2019-03-22 DIAGNOSIS — G4733 Obstructive sleep apnea (adult) (pediatric): Secondary | ICD-10-CM

## 2019-03-22 DIAGNOSIS — I10 Essential (primary) hypertension: Secondary | ICD-10-CM

## 2019-03-22 NOTE — Progress Notes
Subjective     GYNECOLOGIC ONCOLOGY EVALUATION    Name:Valerie Price    Date: 03/22/19    Referring Physician: Lavone Orn    Primary Care Physician: Lavone Orn    Chief Complaint:   Chief Complaint   Patient presents with   ??? Heme/Onc Care       History of Present Illness:  Valerie Price is a 68 y.o. female     Onc Timeline    Valerie Price is a 68 y.o. female patient of Dr. Joycie Peek with Stage IA endometrial carcinoma, FIGO grade 1.  Arrives for 6 month cancer surveillance visit.      She had three vessel stents placed. She also recently had pulmonary testing that was not good and she had to stop. She denies vaginal bleeding or pelvic pain. She has constipation. She currently has a UTI but this is the first since her surgery.    Appetite good, hydrating well and energy level ok.  Denies chest pain, SOA, cough, abdominal pain/bloating, N/V, early satiety, hematuria, hematochezia, vaginal bleeding/discharge, constipation/diarrhea, dysuria, bone pain, LE edema or fever.                Endometrial adenocarcinoma (HCC)    11/17/2017 Initial Diagnosis     Patient referred to GYN on 11/02/2017 for postmenopausal bleeding, emb and pap smear. A sonogram done on 10/20/2017 demonstrated an abnormally thickened endometrium measuring 16 mm. Patient reports bleeding for the past couple months with some brownish discharge. EMB was collected and pathology showed endometrioid adenocarcinoma FIGO grade 1.      12/21/2017 Cancer Staged     Staging form: Corpus Uteri - Carcinoma And Carcinosarcoma, AJCC 8th Edition  - Clinical stage from 12/21/2017: FIGO Stage IA (cT1a, cN0(sn), cM0)      12/21/2017 Surgery     RATLH/BSO/bilateral SLN/retroperitoneal LND and cystourethroscopy per Dr. Lynnell Grain.      12/21/2017 Pathology     Endometrioid adenocarcinoma of the uterus, FIGO grade 1. DOI 4/20 mm. 0/5 LN involved  LVSI (-); MSI (-).    Estrogen Receptor (ER): ??? ??? ??? ??? ???95% ??? ??? Positive ??? ??? Progesterone Receptor (PR): ??? ??? 90% ??? ??? Positive ??? ???   Ki-67: ??? ??? ??? ??? ??? ??? ??? ??? ??? ???20%   p53: ??? ??? ??? ??? ??? ??? ??? ??? ??? ???1% ??? ??? Normal expression         Menstrual Hx               LMP: 20 years ago               Having Periods:  No               Age at first period: 11-12  ???  Pregnancy Hx               Number of pregnancies: None               Number of live births: n/a               Age of first live birth: n/a               Did you breastfeed: No                            If Yes, how long?  Oral Birth Control:  Yes               Years: 1 year               Infertility Medication:  No               Year/Med Name:   ???  Menopausal Hx               Age of last period: Late 20's               Hormone Replacement Therapy:  No               Years:     Health Maintenence:  Pap 11/02/2017-distant history of abnormal, denies any procedures  Mammo 07/2016  Colonoscopy 02/28/2015  Bone Density 06/13/2016    Past Medical History:  Medical History:   Diagnosis Date   ??? Arthritis    ??? B12 deficiency    ??? Back pain    ??? Cirrhosis (HCC) 2012    fatty liver   ??? Diabetes (HCC)     insulin dependent/oral   ??? Endometrial adenocarcinoma (HCC) 10/2017   ??? GERD (gastroesophageal reflux disease)    ??? Hearing reduced    ??? Heart disease     Stress test, 24 hour holter approximately 1 year ago   ??? Hyperlipidemia    ??? Hypertension    ??? Hyperthyroidism 2001    s/p radioactive iodine    ??? NASH (nonalcoholic steatohepatitis)    ??? Obesity    ??? OSA (obstructive sleep apnea)     does not use CPAP but has available at home   ??? PONV (postoperative nausea and vomiting)    ??? Stress incontinence        Past Surgical History:  Surgical History:   Procedure Laterality Date   ??? LIVER BIOPSY  2012   ??? HX CHOLECYSTECTOMY  2012   ??? CATARACT REMOVAL Bilateral 2015   ??? COLONOSCOPY  2016   ??? HX HYSTERECTOMY  12/08/2017   ??? ROBOT ASSISTED LAPAROSCOPIC TOTAL HYSTERECTOMY, BILATERAL SALPINGO-OOPHORECTOMY Bilateral 12/21/2017 Performed by Pernell Dupre, MD at [facility] OR   ??? IDENTIFICATION SENTINEL LYMPH NODE Bilateral 12/21/2017    Performed by Pernell Dupre, MD at [facility] OR   ??? CYSTOURETHROSCOPY N/A 12/21/2017    Performed by Pernell Dupre, MD at [facility] OR   ??? LAPAROSCOPIC RETROPERITONEAL LYMPH NODE BIOPSY - MULTIPLE Bilateral 12/21/2017    Performed by Pernell Dupre, MD at [facility] OR       Medications:    Current Outpatient Medications:   ???  albuterol (VENTOLIN HFA, PROAIR HFA, PROVENTIL HFA) 90 mcg/actuation inhaler, Inhale 2 puffs by mouth into the lungs every 6 hours as needed for Wheezing or Shortness of Breath. Shake well before use., Disp: , Rfl:   ???  ascorbic acid (VITAMIN C) 500 mg tablet, Take 500 mg by mouth daily., Disp: , Rfl:   ???  aspirin-calcium carbonate 81 mg-300 mg calcium(777 mg) tab, 1 Tab, daily, PO, 30 Tab, 0 Number of Refills, 0, Route to Pharmacy Electronically, [facility] - [place], [place], 162.6, cm, 11/25/18 7:24:00 CST, Height, 117, kg, 11/24/18 18:34:00 CST, Admission Weight, Disp: , Rfl:   ???  carvedilol (COREG) 12.5 mg tablet, Take one tablet by mouth twice daily with meals., Disp: 180 tablet, Rfl: 3  ???  cholecalciferol (VITAMIN D-3) 1,000 units tablet, Take 1,000 Units by mouth daily., Disp: , Rfl:   ???  clopiDOGrel (PLAVIX) 75 mg tablet, , Disp: , Rfl:   ???  cyanocobalamin (VITAMIN B-12, RUBRAMIN) 1,000 mcg/mL injection, Inject 1 mL into the muscle every 30 days., Disp: , Rfl:   ???  fish oil /omega-3 fatty acids (SEA-OMEGA) 340/1000 mg capsule, Take 1 capsule by mouth daily., Disp: , Rfl:   ???  furosemide (LASIX) 40 mg tablet, 1 Tab, daily, PO, 90 Tab, 3 Number of Refills, 3, Route to Pharmacy Electronically, [facility], [place], [place], TAB, 162.6, cm, 12/27/18 12:56:00 CDT, Height, 117, kg, 11/24/18 18:34:00 CST, Admission Weight, Disp: , Rfl:   ???  glimepiride (AMARYL) 4 mg tablet, Take 4 mg by mouth daily., Disp: , Rfl:   ???  insulin detemir(+) (LEVEMIR FLEXTOUCH) 100 unit/mL (3 mL) injection pen, Inject 60 Units under the skin at bedtime daily., Disp: , Rfl:   ???  isosorbide mononitrate SR (IMDUR) 30 mg tablet, Take 30 mg by mouth every morning., Disp: , Rfl:   ???  lactobacillus rhamnosus GG (LACTOBACILLUS RHAMNOSUS (GG)) 15 billion cell cpSP capsule, Take 1 capsule by mouth as directed daily., Disp: , Rfl:   ???  linagliptin 5 mg tab, Take 5 mg by mouth daily., Disp: , Rfl:   ???  magnesium oxide (MAG-OX) 400 mg tablet, Take 400 mg by mouth daily., Disp: , Rfl:   ???  metFORMIN (GLUCOPHAGE) 1,000 mg tablet, Take 1,000 mg by mouth twice daily with meals., Disp: , Rfl:   ???  metoprolol XL (TOPROL XL) 25 mg extended release tablet, Take 25 mg by mouth., Disp: , Rfl:   ???  naproxen sodium(+) (ALEVE) 220 mg tablet, Take 1 tablet by mouth twice daily as needed. Take with food., Disp: , Rfl:   ???  omeprazole DR(+) (PRILOSEC) 40 mg capsule, Take 40 mg by mouth daily before breakfast., Disp: , Rfl:   ???  pantoprazole DR (PROTONIX) 40 mg tablet, , Disp: , Rfl:   ???  PAPAYA ENZYME PO, Take 1 capsule by mouth daily as needed., Disp: , Rfl:   ???  potassium chloride (MICRO-K) 10 mEq capsule, Take 10 mEq by mouth., Disp: , Rfl:   ???  quinapril (ACCUPRIL) 40 mg tablet, Take 40 mg by mouth daily., Disp: , Rfl:   ???  rosuvastatin (CRESTOR) 20 mg tablet, , Disp: , Rfl:   ???  vitamin E 400 unit capsule, Take 400 Units by mouth daily., Disp: , Rfl:   ???  vitamins, B complex tab, Take 1 tablet by mouth daily., Disp: , Rfl:   ???  vitamins, multiple tablet, Take 1 tablet by mouth daily., Disp: , Rfl:     Allergies:  Allergies   Allergen Reactions   ??? Keflex [Cephalexin] ANAPHYLAXIS and HIVES   ??? Penicillins ANAPHYLAXIS   ??? Latex HIVES and RASH     tape after surgery, gloves worn by providers make her break out    ??? Aspirin SEE COMMENTS     Severely constipates her.       Social History:  Social History     Socioeconomic History   ??? Marital status: Married     Spouse name: Not on file   ??? Number of children: Not on file ??? Years of education: Not on file   ??? Highest education level: Not on file   Occupational History   ??? Not on file   Tobacco Use   ??? Smoking status: Former Smoker     Packs/day: 1.00     Years: 30.00  Pack years: 30.00     Types: Cigarettes     Last attempt to quit: 12/07/2008     Years since quitting: 10.2   ??? Smokeless tobacco: Never Used   Substance and Sexual Activity   ??? Alcohol use: Yes     Alcohol/week: 0.0 standard drinks     Comment: rarely   ??? Drug use: No   ??? Sexual activity: Not on file   Other Topics Concern   ??? Not on file   Social History Narrative   ??? Not on file       Family History:  Family History   Problem Relation Age of Onset   ??? Diabetes Mother    ??? Heart Disease Mother    ??? Hypertension Mother    ??? Arthritis-osteo Mother    ??? Diabetes Father    ??? Heart Disease Father    ??? Hypertension Father    ??? Thyroid Disease Father    ??? Diabetes Sister    ??? Heart Disease Sister    ??? High Cholesterol Sister    ??? Arthritis-rheumatoid Sister    ??? Migraines Sister    ??? Depression Sister    ??? Cancer Brother    ??? Diabetes Brother    ??? Hypertension Brother    ??? Arthritis-osteo Brother    ??? Arthritis-osteo Maternal Aunt    ??? Cancer-Lung Paternal Aunt    ??? Cancer Paternal Aunt    ??? Diabetes Paternal Aunt    ??? Diabetes Paternal Uncle    ??? Hypertension Paternal Uncle    ??? Cancer-Lung Maternal Grandmother    ??? Diabetes Maternal Grandmother    ??? Heart Disease Maternal Grandmother    ??? Hypertension Maternal Grandmother    ??? Arthritis-rheumatoid Maternal Grandmother    ??? Diabetes Maternal Grandfather    ??? High Cholesterol Paternal Grandmother    ??? Diabetes Paternal Grandfather    ??? Heart Disease Paternal Grandfather          REVIEW OF SYSTEMS:             CONSTITUTIONAL: per HPI  EYES: per HPI  ENT: per HPI  RESPIRATORY: per HPI  CARDIOVASCULAR: per HPI  GI: per HPI  GU: per HPI  MUSCULO-SKELETAL: per HPI  SKIN: per HPI  ENDOCRINE: per HPI  HEMATOLOGIC: per HPI    ECOG 0     Physical Exam: BP (!) 170/60 (BP Source: Arm, Left Upper, Patient Position: Sitting)  - Pulse 104  - Temp 36.1 ???C (96.9 ???F) (Temporal)  - Resp 18  - Ht 162.6 cm (64)  - Wt 115.6 kg (254 lb 12.8 oz)  - SpO2 96%  - BMI 43.74 kg/m???   GENERAL APPEARANCE: Appears healthy.  Alert; in no acute distress.  Pleasant.  HEENT: Unremarkable. No tenderness or masses noted.  NECK: Neck supple. No tenderness. No adenopathy.     LUNGS: Chest symmetrical. Good diaphragmatic excursion. Lungs clear; normal breath sounds.  CARDIOVASCULAR:  RRR. Heart sounds normal.      ABDOMEN: Abdomen soft, non-tender.  Obese. No masses, organomegaly, or hernia. No clinical evidence of ascites.  Well healing incisions.  PELVIC:   External genitalia, anus, perineum, urethral meatus, urethra, bladder and vagina normal. Vaginal cuff intact. Exam chaperoned by nurse  EXTREMITIES: Extremities normal. No joint deformities, edema, or skin discoloration. Station and gait normal.  SKIN: Skin color, texture, turgor normal. No rashes or lesions.  LYMPH NODES: No palpable supraclavicular or inguinal lymph nodes.  CBC w/Diff    Lab Results   Component Value Date/Time    WBC 5.3 11/01/2018 12:14 PM    RBC 4.65 11/01/2018 12:14 PM    HGB 13.4 11/01/2018 12:14 PM    HCT 42.3 11/01/2018 12:14 PM    MCV 90.8 11/01/2018 12:14 PM    MCH 28.8 11/01/2018 12:14 PM    MCHC 31.7 11/01/2018 12:14 PM    RDW 13.9 11/01/2018 12:14 PM    PLTCT 111 (L) 11/01/2018 12:14 PM    MPV 10.0 11/17/2017 01:12 PM    Lab Results   Component Value Date/Time    NEUT 56.1 11/01/2018 12:14 PM    ANC 3.0 11/01/2018 12:14 PM    LYMA 25 11/17/2017 01:12 PM    ALC 1.8 11/01/2018 12:14 PM    MONA 7 11/17/2017 01:12 PM    AMC 0.4 11/01/2018 12:14 PM    EOSA 1 11/17/2017 01:12 PM    AEC 0.1 11/01/2018 12:14 PM    BASA 0 11/17/2017 01:12 PM    ABC 0.0 11/01/2018 12:14 PM          Comprehensive Metabolic Profile    Lab Results   Component Value Date/Time    NA 139 11/01/2018 12:14 PM    K 4.4 11/01/2018 12:14 PM CL 102 11/01/2018 12:14 PM    CO2 27.0 11/01/2018 12:14 PM    GAP 14 11/01/2018 12:14 PM    BUN 12.0 11/01/2018 12:14 PM    CR 0.83 11/01/2018 12:14 PM    GLU 134 (H) 11/01/2018 12:14 PM    Lab Results   Component Value Date/Time    CA 9.6 11/01/2018 12:14 PM    ALBUMIN 3.9 11/01/2018 12:14 PM    TOTPROT 7.7 11/01/2018 12:14 PM    ALKPHOS 68 11/01/2018 12:14 PM    AST 22 11/01/2018 12:14 PM    ALT 24 11/01/2018 12:14 PM    TOTBILI 0.40 11/01/2018 12:14 PM    GFR 72.4 11/01/2018 12:14 PM    GFRAA >60 11/17/2017 01:12 PM          Other labs    No results found for: ESR No results found for: LDH       ASSESSMENT/PLAN:  Valerie Price is a 68 y.o. female  With Stage IA endometrial adenocarcinoma.  sp Robotic TLH BSO, sentinel lymph node biopsy, cystoscopy    Clinically NED of endometrial cancer    Multiple medical issues requiring close fu with PCP    Breast and colon health surveillance with PCP    Plan for surveillance q 6 months for first 2 years and then yearly for three years.        Burgess Estelle, MD  Gynecologic Oncology

## 2019-05-03 ENCOUNTER — Encounter: Admit: 2019-05-03 | Discharge: 2019-05-03

## 2019-05-03 DIAGNOSIS — K7581 Nonalcoholic steatohepatitis (NASH): Secondary | ICD-10-CM

## 2019-05-03 DIAGNOSIS — K7469 Other cirrhosis of liver: Secondary | ICD-10-CM

## 2019-05-03 NOTE — Telephone Encounter
Call from [facility] regarding [place] and lab orders.  Please return her call.

## 2019-05-03 NOTE — Telephone Encounter
Lab orders have been faxed.

## 2019-05-03 NOTE — Telephone Encounter
Returned call- [place] order faxed to [facility].  Awaiting NP signature for labs to be faxed.

## 2019-05-27 ENCOUNTER — Encounter: Admit: 2019-05-27 | Discharge: 2019-05-27

## 2019-05-27 DIAGNOSIS — K7469 Other cirrhosis of liver: Secondary | ICD-10-CM

## 2019-05-27 DIAGNOSIS — K7581 Nonalcoholic steatohepatitis (NASH): Secondary | ICD-10-CM

## 2019-05-30 ENCOUNTER — Encounter: Admit: 2019-05-30 | Discharge: 2019-05-30

## 2019-05-30 DIAGNOSIS — K7581 Nonalcoholic steatohepatitis (NASH): Secondary | ICD-10-CM

## 2019-05-30 DIAGNOSIS — K7469 Other cirrhosis of liver: Secondary | ICD-10-CM

## 2019-05-30 LAB — CBC AND DIFF
Lab: 0.1
Lab: 0.4
Lab: 1.7
Lab: 32 (ref 0.0–0.2)
Lab: 1.1 U/L
Lab: 1.1 g/dL
Lab: 116 mg/dL — ABNORMAL LOW (ref 130–400)
Lab: 12 mmol/L (ref 0.16–1.0)
Lab: 14 — ABNORMAL HIGH (ref 70–105)
Lab: 28 mg/dL
Lab: 3.4
Lab: 30 U/L — ABNORMAL HIGH (ref 5–34)
Lab: 4.4 (ref 8–54)
Lab: 40 meq/L — ABNORMAL HIGH (ref 0–14)
Lab: 5.6 mmol/L (ref 10–40)
Lab: 60 mg/dL
Lab: 7.4 U/L
Lab: 90 mg/dL (ref 0.0–0.8)

## 2019-05-30 LAB — LIPID PROFILE
Lab: 117
Lab: 147
Lab: 2
Lab: 29
Lab: 40
Lab: 48

## 2019-05-30 LAB — 25-OH VITAMIN D (D2 + D3): Lab: 45 K/UL (ref 150–400)

## 2019-05-30 LAB — COMPREHENSIVE METABOLIC PANEL: Lab: 137 mmol/L (ref 136–145)

## 2019-05-30 LAB — ALPHA FETO PROTEIN (AFP): Lab: 3.4 U/L (ref 7–40)

## 2019-05-30 LAB — PROTIME INR (PT): Lab: 11 s — AB (ref 3.1–5.4)

## 2019-05-30 NOTE — Progress Notes
RN reviewed. Will review results with pt during f/u appt on 06/03/2019.  Eda Keys RN, BSN

## 2019-05-30 NOTE — Telephone Encounter
Returned call to pt. Pt wants to cancel 06/03/19 OV w/[facility] due to not feeling safe coming into hospital for routine visit. Offered telehealth but pt does not have computer or smartphone for video. Offered telephone appt but pt states she would rather just cancel altogether and call back after COVID to reschedule. Pt states she had labs and imaging completed recently and PCP told her labs look normal.

## 2019-05-30 NOTE — Progress Notes
Outside [place] and labs reviewed. No evidence of hepatoma. Stable AFP. Plan to repeat [place] screening again in 6 months. Spoke to pt on the phone and she is agreeable.

## 2019-05-30 NOTE — Telephone Encounter
Please return a call to Valerie Price regarding cancellation of her 06/03/19 appointment with Dr. Idolina Primer.

## 2019-05-31 ENCOUNTER — Encounter: Admit: 2019-05-31 | Discharge: 2019-05-31

## 2019-05-31 DIAGNOSIS — K7581 Nonalcoholic steatohepatitis (NASH): Secondary | ICD-10-CM

## 2019-05-31 DIAGNOSIS — K7469 Other cirrhosis of liver: Secondary | ICD-10-CM

## 2019-10-13 ENCOUNTER — Encounter: Admit: 2019-10-13 | Discharge: 2019-10-13 | Payer: MEDICARE

## 2019-10-13 DIAGNOSIS — M549 Dorsalgia, unspecified: Secondary | ICD-10-CM

## 2019-10-13 DIAGNOSIS — H9190 Unspecified hearing loss, unspecified ear: Secondary | ICD-10-CM

## 2019-10-13 DIAGNOSIS — I519 Heart disease, unspecified: Secondary | ICD-10-CM

## 2019-10-13 DIAGNOSIS — C541 Malignant neoplasm of endometrium: Secondary | ICD-10-CM

## 2019-10-13 DIAGNOSIS — I10 Essential (primary) hypertension: Secondary | ICD-10-CM

## 2019-10-13 DIAGNOSIS — E538 Deficiency of other specified B group vitamins: Secondary | ICD-10-CM

## 2019-10-13 DIAGNOSIS — E669 Obesity, unspecified: Secondary | ICD-10-CM

## 2019-10-13 DIAGNOSIS — K219 Gastro-esophageal reflux disease without esophagitis: Secondary | ICD-10-CM

## 2019-10-13 DIAGNOSIS — K7460 Unspecified cirrhosis of liver: Secondary | ICD-10-CM

## 2019-10-13 DIAGNOSIS — G4733 Obstructive sleep apnea (adult) (pediatric): Secondary | ICD-10-CM

## 2019-10-13 DIAGNOSIS — E119 Type 2 diabetes mellitus without complications: Secondary | ICD-10-CM

## 2019-10-13 DIAGNOSIS — K7581 Nonalcoholic steatohepatitis (NASH): Secondary | ICD-10-CM

## 2019-10-13 DIAGNOSIS — E785 Hyperlipidemia, unspecified: Secondary | ICD-10-CM

## 2019-10-13 DIAGNOSIS — L821 Other seborrheic keratosis: Secondary | ICD-10-CM

## 2019-10-13 DIAGNOSIS — L723 Sebaceous cyst: Secondary | ICD-10-CM

## 2019-10-13 DIAGNOSIS — M1990 Unspecified osteoarthritis, unspecified site: Secondary | ICD-10-CM

## 2019-10-13 DIAGNOSIS — R112 Nausea with vomiting, unspecified: Secondary | ICD-10-CM

## 2019-10-13 DIAGNOSIS — E0590 Thyrotoxicosis, unspecified without thyrotoxic crisis or storm: Secondary | ICD-10-CM

## 2019-10-13 DIAGNOSIS — N393 Stress incontinence (female) (male): Secondary | ICD-10-CM

## 2019-10-13 NOTE — Progress Notes
Subjective     GYNECOLOGIC ONCOLOGY EVALUATION    Name:Valerie Price    Date: 10/13/19    Referring Physician & Primary Care Physician: Lavone Orn    Chief Complaint:   Chief Complaint   Patient presents with   ? Heme/Onc Care     History of Present Illness:  Valerie Price is a 69 y.o. female with the following history.     Onc Timeline Overview Note   Valerie Price is a 69 y.o. female patient of Dr. Joycie Peek with Stage IA endometrial carcinoma, FIGO grade 1.  Patient presents to clinic today for her 1 year and 10 month cancer surveillance visit.     Patient presents today with the following complaints. Recently started Novolog 20 U prior to largest meal of the day. AM blood sugars running 130s. Constipation that she relates to taking aspirin daily, LBM 3 days ago, managing with OTC stool softeners with colace and increasing fiber in diet. Energy is fair, admits she does not sleep well at night, sleeps from 4AM till 10 AM and takes a nap during the day, has attempted to take melatonin but it causes drowsiness during the day. Fatigue is not interfering with ADLs. SOA per baseline on exertion. Intermittent LE edema improved.     Appetite is good, she is hydrating well. She denies fevers, chills, chest pain, cough, abdominal pain/distention, nausea/vomiting, diarrhea, hematochezia, hematuria, dysuria, vaginal bleeding/discharge, swelling in groin/neck, and bone pain.     Health Maintenence:  Mammo: Fall 2020, Normal   Colonoscopy: 02/28/2015  Bone Density: 06/13/2016     Endometrial adenocarcinoma (HCC)   11/17/2017 Initial Diagnosis    Patient referred to GYN on 11/02/2017 for postmenopausal bleeding, emb and pap smear. A sonogram done on 10/20/2017 demonstrated an abnormally thickened endometrium measuring 16 mm. Patient reports bleeding for the past couple months with some brownish discharge. EMB was collected and pathology showed endometrioid adenocarcinoma FIGO grade 1.     12/21/2017 Cancer Staged Staging form: Corpus Uteri - Carcinoma And Carcinosarcoma, AJCC 8th Edition  - Clinical stage from 12/21/2017: FIGO Stage IA (cT1a, cN0(sn), cM0)     12/21/2017 Surgery    RATLH/BSO/bilateral SLN/retroperitoneal LND and cystourethroscopy per Dr. Lynnell Grain.     12/21/2017 Pathology    Endometrioid adenocarcinoma of the uterus, FIGO grade 1. DOI 4/20 mm. 0/5 LN involved  LVSI (-); MSI (-).    Estrogen Receptor (ER): ? ? ? ? ?95% ? ? Positive ? ?   Progesterone Receptor (PR): ? ? 90% ? ? Positive ? ?   Ki-67: ? ? ? ? ? ? ? ? ? ?20%   p53: ? ? ? ? ? ? ? ? ? ?1% ? ? Normal expression       GYN History   Pap: 11/02/2017-distant history of abnormal, denies any procedures  Menstrual Hx               LMP: 20 years ago               Having Periods:  No               Age at first period: 11-12    Pregnancy Hx               Number of pregnancies: None               Number of live births: n/a  Age of first live birth: n/a               Did you breastfeed: No                            If Yes, how long?                Oral Birth Control:  Yes               Years: 1 year               Infertility Medication:  No               Year/Med Name:   ?  Menopausal Hx               Age of last period: Late 88's               Hormone Replacement Therapy:  No               Years:     Past Medical History:  Medical History:   Diagnosis Date   ? Arthritis    ? B12 deficiency    ? Back pain    ? 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 Diabetes (HCC)     insulin dependent/oral   ? Endometrial adenocarcinoma (HCC) 10/2017   ? GERD (gastroesophageal reflux disease)    ? Hearing reduced    ? Heart disease     Stress test, 24 hour holter approximately 1 year ago   ? Hyperlipidemia    ? Hypertension    ? Hyperthyroidism 2001    s/p radioactive iodine    ? NASH (nonalcoholic steatohepatitis)    ? Obesity    ? OSA (obstructive sleep apnea)     does not use CPAP but has available at home   ? PONV (postoperative nausea and vomiting) ? Stress incontinence      Past Surgical History:  Surgical History:   Procedure Laterality Date   ? LIVER BIOPSY  2012   ? HX CHOLECYSTECTOMY  2012   ? CATARACT REMOVAL Bilateral 2015   ? COLONOSCOPY  2016   ? HX HYSTERECTOMY  12/08/2017   ? ROBOT ASSISTED LAPAROSCOPIC TOTAL HYSTERECTOMY, BILATERAL SALPINGO-OOPHORECTOMY Bilateral 12/21/2017    Performed by Pernell Dupre, MD at [facility] OR   ? IDENTIFICATION SENTINEL LYMPH NODE Bilateral 12/21/2017    Performed by [facility]ernell Dupre[facility] at [facility] OR   ? CYSTOURETHROSCOPY N/A 12/21/2017    Performed by Pernell Dupre, MD at [facility] OR   ? LAPAROSCOPIC RETROPERITONEAL LYMPH NODE BIOPSY - MULTIPLE Bilateral 12/21/2017    Performed by Pernell Dupre, MD at [facility] OR   ? CORONARY STENT PLACEMENT       Medications:    Current Outpatient Medications:   ?  acetaminophen (TYLENOL) 500 mg tablet, Take 500 mg by mouth every 6 hours as needed for Pain. Max of 4,000 mg of acetaminophen in 24 hours., Disp: , Rfl:   ?  albuterol (VENTOLIN HFA, PROAIR HFA, PROVENTIL HFA) 90 mcg/actuation inhaler, Inhale 2 puffs by mouth into the lungs every 6 hours as needed for Wheezing or Shortness of Breath. Shake well before use., Disp: , Rfl:   ?  ascorbic acid (VITAMIN C) 500 mg tablet, Take 500 mg by mouth daily., Disp: , Rfl:   ?  aspirin-calcium carbonate 81 mg-300  mg calcium(777 mg) tab, 1 Tab, daily, PO, 30 Tab, 0 Number of Refills, 0, Route to Pharmacy Electronically, [facility] - [place], [place], 162.6, cm, 11/25/18 7:24:00 CST, Height, 117, kg, 11/24/18 18:34:00 CST, Admission Weight, Disp: , Rfl:   ?  carvedilol (COREG) 12.5 mg tablet, Take one tablet by mouth twice daily with meals., Disp: 180 tablet, Rfl: 3  ?  cholecalciferol (VITAMIN D-3) 1,000 units tablet, Take 1,000 Units by mouth daily., Disp: , Rfl:   ?  clopiDOGrel (PLAVIX) 75 mg tablet, , Disp: , Rfl:   ?  cyanocobalamin (VITAMIN B-12, RUBRAMIN) 1,000 mcg/mL injection, Inject 1 mL into the muscle every 30 days., Disp: , Rfl: ?  fish oil /omega-3 fatty acids (SEA-OMEGA) 340/1000 mg capsule, Take 1 capsule by mouth daily., Disp: , Rfl:   ?  furosemide (LASIX) 40 mg tablet, 1 Tab, daily, PO, 90 Tab, 3 Number of Refills, 3, Route to Pharmacy Electronically, [facility], [place], [place], TAB, 162.6, cm, 12/27/18 12:56:00 CDT, Height, 117, kg, 11/24/18 18:34:00 CST, Admission Weight, Disp: , Rfl:   ?  glimepiride (AMARYL) 4 mg tablet, Take 4 mg by mouth daily., Disp: , Rfl:   ?  insulin aspart U-100 (NOVOLOG FLEXPEN U-100 INSULIN) 100 unit/mL (3 mL) injection PEN, Inject 20 Units under the skin. Largest meal of the day, Disp: , Rfl:   ?  insulin detemir(+) (LEVEMIR FLEXTOUCH) 100 unit/mL (3 mL) injection pen, Inject 60 Units under the skin at bedtime daily., Disp: , Rfl:   ?  isosorbide mononitrate SR (IMDUR) 30 mg tablet, Take 30 mg by mouth every morning., Disp: , Rfl:   ?  lactobacillus rhamnosus GG (LACTOBACILLUS RHAMNOSUS (GG)) 15 billion cell cpSP capsule, Take 1 capsule by mouth as directed daily., Disp: , Rfl:   ?  linagliptin 5 mg tab, Take 5 mg by mouth daily., Disp: , Rfl:   ?  magnesium oxide (MAG-OX) 400 mg tablet, Take 400 mg by mouth daily., Disp: , Rfl:   ?  metFORMIN (GLUCOPHAGE) 1,000 mg tablet, Take 1,000 mg by mouth twice daily with meals., Disp: , Rfl:   ?  metoprolol XL (TOPROL XL) 25 mg extended release tablet, Take 25 mg by mouth., Disp: , Rfl:   ?  naproxen sodium(+) (ALEVE) 220 mg tablet, Take 1 tablet by mouth twice daily as needed. Take with food., Disp: , Rfl:   ?  omeprazole DR(+) (PRILOSEC) 40 mg capsule, Take 40 mg by mouth daily before breakfast., Disp: , Rfl:   ?  pantoprazole DR (PROTONIX) 40 mg tablet, , Disp: , Rfl:   ?  PAPAYA ENZYME PO, Take 1 capsule by mouth daily as needed., Disp: , Rfl:   ?  potassium chloride (MICRO-K) 10 mEq capsule, Take 10 mEq by mouth., Disp: , Rfl:   ?  quinapril (ACCUPRIL) 40 mg tablet, Take 40 mg by mouth daily., Disp: , Rfl: ?  rosuvastatin (CRESTOR) 20 mg tablet, , Disp: , Rfl:   ?  vitamin E 400 unit capsule, Take 400 Units by mouth daily., Disp: , Rfl:   ?  vitamins, B complex tab, Take 1 tablet by mouth daily., Disp: , Rfl:   ?  vitamins, multiple tablet, Take 1 tablet by mouth daily., Disp: , Rfl:     Allergies:  Allergies   Allergen Reactions   ? Keflex [Cephalexin] ANAPHYLAXIS and HIVES   ? Penicillins ANAPHYLAXIS   ? Latex HIVES and RASH     tape after surgery, gloves worn  by providers make her break out    ? Aspirin SEE COMMENTS     Severely constipates her.     Social History:  Social History     Socioeconomic History   ? Marital status: Married     Spouse name: Not on file   ? Number of children: Not on file   ? Years of education: Not on file   ? Highest education level: Not on file   Occupational History   ? Not on file   Tobacco Use   ? Smoking status: Former Smoker     Packs/day: 1.00     Years: 30.00     Pack years: 30.00     Types: Cigarettes     Quit date: 12/07/2008     Years since quitting: 10.8   ? Smokeless tobacco: Never Used   Substance and Sexual Activity   ? Alcohol use: Yes     Alcohol/week: 0.0 standard drinks     Comment: rarely   ? Drug use: No   ? Sexual activity: Not on file   Other Topics Concern   ? Not on file   Social History Narrative   ? Not on file     Family History:  Family History   Problem Relation Age of Onset   ? Diabetes Mother    ? Heart Disease Mother    ? Hypertension Mother    ? Arthritis-osteo Mother    ? Diabetes Father    ? Heart Disease Father    ? Hypertension Father    ? Thyroid Disease Father    ? Diabetes Sister    ? Heart Disease Sister    ? High Cholesterol Sister    ? Arthritis-rheumatoid Sister    ? Migraines Sister    ? Depression Sister    ? Cancer Brother    ? Diabetes Brother    ? Hypertension Brother    ? Arthritis-osteo Brother    ? Arthritis-osteo Maternal Aunt    ? Cancer-Lung Paternal Aunt    ? Cancer Paternal Aunt    ? Diabetes Paternal Aunt ? Diabetes Paternal Uncle    ? Hypertension Paternal Uncle    ? Cancer-Lung Maternal Grandmother    ? Diabetes Maternal Grandmother    ? Heart Disease Maternal Grandmother    ? Hypertension Maternal Grandmother    ? Arthritis-rheumatoid Maternal Grandmother    ? Diabetes Maternal Grandfather    ? High Cholesterol Paternal Grandmother    ? Diabetes Paternal Grandfather    ? Heart Disease Paternal Grandfather      REVIEW OF SYSTEMS:             CONSTITUTIONAL: Negative unless stated in HPI  EYES: Negative unless stated in HPI  ENT: Negative unless stated in HPI  RESPIRATORY: Negative unless stated in HPI  CARDIOVASCULAR: Negative unless stated in HPI  GI: Negative unless stated in HPI  GU: Negative unless stated in HPI  MUSCULO-SKELETAL: Negative unless stated in HPI  SKIN: Negative unless stated in HPI    ECOG 1     Physical Exam:  BP 133/61 (BP Source: Arm, Left Upper, Patient Position: Sitting)  - Pulse 96  - Temp 36.3 ?C (97.3 ?F) (Temporal)  - Ht 162.6 cm (64)  - Wt 118.6 kg (261 lb 6.4 oz)  - SpO2 96%  - BMI 44.87 kg/m?   GENERAL APPEARANCE: Appears healthy. Alert; in no acute distress.  Pleasant.  HEENT: Unremarkable. No tenderness or masses  noted.  NECK: Neck supple. No tenderness. No adenopathy.     LUNGS: Chest symmetrical. Good diaphragmatic excursion. Lungs clear; normal breath sounds.  CARDIOVASCULAR: RRR. Heart sounds normal.      ABDOMEN: Abdomen obese, soft, non-tender. No masses, organomegaly, or hernia. No clinical evidence of ascites. Incisions well healed.   PELVIC: Left vulva with two benign appearing sebaceous cysts. Otherwise external genitalia, anus, perineum, urethral meatus, urethra, bladder and vagina normal. Cervix, uterus, and adnexae are surgically absent. Speculum exam with well approximated vaginal cuff. No erythema, bleeding, abnormal discharge, or lesions noted. Bimanual exam without cuff separation, masses or nodularity on the pelvic floor. Exam chaperoned by nurse EXTREMITIES: Extremities normal. No joint deformities, edema, or skin discoloration. Station and gait normal.  SKIN: Multiple pasted on appearing lesions to abdomen and mons pubis, c/w seborrheic keratoses. Skin color, texture, turgor normal. No rashes or lesions.  LYMPH NODES: No palpable supraclavicular or inguinal lymph nodes.    CBC w/Diff    Lab Results   Component Value Date/Time    WBC 5.6 05/25/2019 11:45 AM    RBC 4.45 05/25/2019 11:45 AM    HGB 12.9 05/25/2019 11:45 AM    HCT 40.1 05/25/2019 11:45 AM    MCV 90.1 05/25/2019 11:45 AM    MCH 28.9 05/25/2019 11:45 AM    MCHC 32.1 05/25/2019 11:45 AM    RDW 14.5 05/25/2019 11:45 AM    PLTCT 116 (L) 05/25/2019 11:45 AM    MPV 10.0 11/17/2017 01:12 PM    Lab Results   Component Value Date/Time    NEUT 60.2 05/25/2019 11:45 AM    ANC 3.4 05/25/2019 11:45 AM    LYMA 25 11/17/2017 01:12 PM    ALC 1.7 05/25/2019 11:45 AM    MONA 7 11/17/2017 01:12 PM    AMC 0.4 05/25/2019 11:45 AM    EOSA 1 11/17/2017 01:12 PM    AEC 0.1 05/25/2019 11:45 AM    BASA 0 11/17/2017 01:12 PM    ABC 0.1 05/25/2019 11:45 AM          Comprehensive Metabolic Profile    Lab Results   Component Value Date/Time    NA 137 05/25/2019 11:45 AM    K 4.2 05/25/2019 11:45 AM    CL 101 05/25/2019 11:45 AM    CO2 23.0 05/25/2019 11:45 AM    GAP 17 (H) 05/25/2019 11:45 AM    BUN 11.0 05/25/2019 11:45 AM    CR 0.88 05/25/2019 11:45 AM    GLU 149 (H) 05/25/2019 11:45 AM    Lab Results   Component Value Date/Time    CA 9.5 05/25/2019 11:45 AM    ALBUMIN 3.9 05/25/2019 11:45 AM    TOTPROT 8.0 05/25/2019 11:45 AM    ALKPHOS 72 05/25/2019 11:45 AM    AST 36 (H) 05/25/2019 11:45 AM    ALT 28 05/25/2019 11:45 AM    TOTBILI 0.39 05/25/2019 11:45 AM    GFR 67.5 05/25/2019 11:45 AM    GFRAA >60 11/17/2017 01:12 PM          Other labs    No results found for: ESR No results found for: LDH     ASSESSMENT/PLAN: Valerie Price is a 69 y.o. female  with Stage IA endometrial carcinoma, FIGO grade 1.  Patient presents to clinic today for her 1 year and 10 month cancer surveillance visit.   ? No evidence of disease on exam today.   ? Multiple, chronic, underlying conditions. Continue with  close follow up with PCP.   ? Recommend daily exercise, diet high in fruits and vegetables, annual visits with PCP including updated vaccination and routine screening (mammogram, DEXA and colonoscopy).    ? Discussed possible symptoms of cancer recurrence, such as cough, chest pain, early satiety, abdominal pain/bloating, N/V, vaginal bleeding/discharge or change in bowel/bladder habits, dizziness, HA.    ? Patient verbalizes understanding of these instructions, denies further questions at this time.   ? RV with Dr. Reine Just in 6 months, then space out to yearly visits.     Johnell Comings, MSN, APRN, FNP-C  Gynecologic Oncology    Total time spent date of service: 30 minutes, which was spent in review of patient's history, coordination of patient care, education and counseling of patient. Discussed the patient's diagnosis, management, and reviewed goals.

## 2019-10-18 ENCOUNTER — Encounter: Admit: 2019-10-18 | Discharge: 2019-10-18 | Payer: MEDICARE

## 2019-10-18 NOTE — Telephone Encounter
Returned pt call, notified her that orders were faxed to [facility]. Requested call back once orders were completed so [facility] can request results. Pt v.u.    Eda Keys RN, BSN

## 2019-10-18 NOTE — Telephone Encounter
Patient called in and asked that the lab and [place] orders be faxed to [facility] ([facility]

## 2019-11-29 ENCOUNTER — Encounter: Admit: 2019-11-29 | Discharge: 2019-11-29 | Payer: MEDICARE

## 2019-11-29 DIAGNOSIS — K7469 Other cirrhosis of liver: Secondary | ICD-10-CM

## 2019-11-29 DIAGNOSIS — K7581 Nonalcoholic steatohepatitis (NASH): Secondary | ICD-10-CM

## 2019-11-29 LAB — CBC AND DIFF
Lab: 12
Lab: 28
Lab: 39
Lab: 4.3
Lab: 6
Lab: 90

## 2019-11-29 NOTE — Telephone Encounter
Patient called in to advise she got blood work and tests done today at [facility]. Results will be in in a couple of days

## 2019-12-05 ENCOUNTER — Encounter: Admit: 2019-12-05 | Discharge: 2019-12-05 | Payer: MEDICARE

## 2019-12-05 DIAGNOSIS — K7581 Nonalcoholic steatohepatitis (NASH): Secondary | ICD-10-CM

## 2019-12-05 DIAGNOSIS — K7469 Other cirrhosis of liver: Secondary | ICD-10-CM

## 2019-12-05 NOTE — Telephone Encounter
Received incomplete set of lab results. Called [place] medical records to request CMP, PT/INR and AFP result. Medical records states results are available and they will fax now.     Eda Keys RN, BSN

## 2019-12-13 ENCOUNTER — Encounter: Admit: 2019-12-13 | Discharge: 2019-12-13 | Payer: MEDICARE

## 2019-12-13 DIAGNOSIS — K7469 Other cirrhosis of liver: Secondary | ICD-10-CM

## 2019-12-13 DIAGNOSIS — K7581 Nonalcoholic steatohepatitis (NASH): Secondary | ICD-10-CM

## 2019-12-16 ENCOUNTER — Encounter: Admit: 2019-12-16 | Discharge: 2019-12-16 | Payer: MEDICARE

## 2019-12-16 ENCOUNTER — Ambulatory Visit: Admit: 2019-12-16 | Discharge: 2019-12-17 | Payer: MEDICARE

## 2019-12-16 DIAGNOSIS — E119 Type 2 diabetes mellitus without complications: Secondary | ICD-10-CM

## 2019-12-16 DIAGNOSIS — R112 Nausea with vomiting, unspecified: Secondary | ICD-10-CM

## 2019-12-16 DIAGNOSIS — I10 Essential (primary) hypertension: Secondary | ICD-10-CM

## 2019-12-16 DIAGNOSIS — M1990 Unspecified osteoarthritis, unspecified site: Secondary | ICD-10-CM

## 2019-12-16 DIAGNOSIS — E669 Obesity, unspecified: Secondary | ICD-10-CM

## 2019-12-16 DIAGNOSIS — M549 Dorsalgia, unspecified: Secondary | ICD-10-CM

## 2019-12-16 DIAGNOSIS — E0590 Thyrotoxicosis, unspecified without thyrotoxic crisis or storm: Secondary | ICD-10-CM

## 2019-12-16 DIAGNOSIS — G4733 Obstructive sleep apnea (adult) (pediatric): Secondary | ICD-10-CM

## 2019-12-16 DIAGNOSIS — E538 Deficiency of other specified B group vitamins: Secondary | ICD-10-CM

## 2019-12-16 DIAGNOSIS — K7581 Nonalcoholic steatohepatitis (NASH): Secondary | ICD-10-CM

## 2019-12-16 DIAGNOSIS — K7460 Unspecified cirrhosis of liver: Secondary | ICD-10-CM

## 2019-12-16 DIAGNOSIS — C541 Malignant neoplasm of endometrium: Secondary | ICD-10-CM

## 2019-12-16 DIAGNOSIS — H9190 Unspecified hearing loss, unspecified ear: Secondary | ICD-10-CM

## 2019-12-16 DIAGNOSIS — K219 Gastro-esophageal reflux disease without esophagitis: Secondary | ICD-10-CM

## 2019-12-16 DIAGNOSIS — E785 Hyperlipidemia, unspecified: Secondary | ICD-10-CM

## 2019-12-16 DIAGNOSIS — N393 Stress incontinence (female) (male): Secondary | ICD-10-CM

## 2019-12-16 DIAGNOSIS — I519 Heart disease, unspecified: Secondary | ICD-10-CM

## 2019-12-17 DIAGNOSIS — K7581 Nonalcoholic steatohepatitis (NASH): Principal | ICD-10-CM

## 2019-12-19 ENCOUNTER — Encounter: Admit: 2019-12-19 | Discharge: 2019-12-19 | Payer: MEDICARE

## 2019-12-19 DIAGNOSIS — M549 Dorsalgia, unspecified: Secondary | ICD-10-CM

## 2019-12-19 DIAGNOSIS — K219 Gastro-esophageal reflux disease without esophagitis: Secondary | ICD-10-CM

## 2019-12-19 DIAGNOSIS — G4733 Obstructive sleep apnea (adult) (pediatric): Secondary | ICD-10-CM

## 2019-12-19 DIAGNOSIS — E119 Type 2 diabetes mellitus without complications: Secondary | ICD-10-CM

## 2019-12-19 DIAGNOSIS — K7581 Nonalcoholic steatohepatitis (NASH): Secondary | ICD-10-CM

## 2019-12-19 DIAGNOSIS — H9190 Unspecified hearing loss, unspecified ear: Secondary | ICD-10-CM

## 2019-12-19 DIAGNOSIS — I519 Heart disease, unspecified: Secondary | ICD-10-CM

## 2019-12-19 DIAGNOSIS — M1990 Unspecified osteoarthritis, unspecified site: Secondary | ICD-10-CM

## 2019-12-19 DIAGNOSIS — R112 Nausea with vomiting, unspecified: Secondary | ICD-10-CM

## 2019-12-19 DIAGNOSIS — E669 Obesity, unspecified: Secondary | ICD-10-CM

## 2019-12-19 DIAGNOSIS — I10 Essential (primary) hypertension: Secondary | ICD-10-CM

## 2019-12-19 DIAGNOSIS — E785 Hyperlipidemia, unspecified: Secondary | ICD-10-CM

## 2019-12-19 DIAGNOSIS — Z9889 Other specified postprocedural states: Secondary | ICD-10-CM

## 2019-12-19 DIAGNOSIS — E538 Deficiency of other specified B group vitamins: Secondary | ICD-10-CM

## 2019-12-19 DIAGNOSIS — E0590 Thyrotoxicosis, unspecified without thyrotoxic crisis or storm: Secondary | ICD-10-CM

## 2019-12-19 DIAGNOSIS — K7460 Unspecified cirrhosis of liver: Secondary | ICD-10-CM

## 2019-12-19 DIAGNOSIS — N393 Stress incontinence (female) (male): Secondary | ICD-10-CM

## 2019-12-19 DIAGNOSIS — C541 Malignant neoplasm of endometrium: Secondary | ICD-10-CM

## 2020-04-16 NOTE — Progress Notes
Subjective     GYNECOLOGIC ONCOLOGY EVALUATION    Name:Valerie Price    Date: 04/17/20    Referring Physician & Primary Care Physician: Lavone Orn    Chief Complaint:   Chief Complaint   Patient presents with   ? Heme/Onc Care     History of Present Illness:  Valerie Price is a 69 y.o. female with the following history.     Onc Timeline Overview Note   Valerie Price is a 69 y.o. female patient of Dr. Joycie Peek with Stage IA endometrial carcinoma, FIGO grade 1.  Patient presents to clinic today for her 2 years and 4 month cancer surveillance visit.     Patient presents today and she is doing well. She just got back from a wedding in [place]. Reports some irritation in her inguinal skin folds due to traveling and using softened water and different soap to her peri area. She started applying baby power to the area to day.    She just got a colonoscopy a few weeks ago, and does not have the results of her polypectomy yet. She is up to date with mammograms and is due for another one in the fall that will be set up by her PCP. She has also completed her covid19 vaccine series in February 2021.    Reports she has a good appetite and she is hydrating well.      Denies vaginal bleeding, vaginal discharge, pelvic pain, fever, chills, abdominal pain, abdominal bloating, hematuria, hematochezia, changes in bladder or bowel function, LE edema, chest pain, or cough.      Health Maintenence:  Mammo: Fall 2020, Normal   Colonoscopy: 2021, awaiting results.   Bone Density: 06/13/2016     Endometrial adenocarcinoma (HCC)   11/17/2017 Initial Diagnosis    Patient referred to GYN on 11/02/2017 for postmenopausal bleeding, emb and pap smear. A sonogram done on 10/20/2017 demonstrated an abnormally thickened endometrium measuring 16 mm. Patient reports bleeding for the past couple months with some brownish discharge. EMB was collected and pathology showed endometrioid adenocarcinoma FIGO grade 1.     12/21/2017 Cancer Staged Staging form: Corpus Uteri - Carcinoma And Carcinosarcoma, AJCC 8th Edition  - Clinical stage from 12/21/2017: FIGO Stage IA (cT1a, cN0(sn), cM0)     12/21/2017 Surgery    RATLH/BSO/bilateral SLN/retroperitoneal LND and cystourethroscopy per Dr. Lynnell Grain.     12/21/2017 Pathology    Endometrioid adenocarcinoma of the uterus, FIGO grade 1. DOI 4/20 mm. 0/5 LN involved  LVSI (-); MSI (-).    Estrogen Receptor (ER): ? ? ? ? ?95% ? ? Positive ? ?   Progesterone Receptor (PR): ? ? 90% ? ? Positive ? ?   Ki-67: ? ? ? ? ? ? ? ? ? ?20%   p53: ? ? ? ? ? ? ? ? ? ?1% ? ? Normal expression       GYN History   Pap: 11/02/2017-distant history of abnormal, denies any procedures  Menstrual Hx               LMP: 20 years ago               Having Periods:  No               Age at first period: 11-12    Pregnancy Hx               Number of pregnancies: None  Number of live births: n/a               Age of first live birth: n/a               Did you breastfeed: No                            If Yes, how long?                Oral Birth Control:  Yes               Years: 1 year               Infertility Medication:  No               Year/Med Name:   ?  Menopausal Hx               Age of last period: Late 1's               Hormone Replacement Therapy:  No               Years:     Past Medical History:  Medical History:   Diagnosis Date   ? Arthritis    ? B12 deficiency    ? Back pain    ? 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 Diabetes (HCC)     insulin dependent/oral   ? Endometrial adenocarcinoma (HCC) 10/2017   ? GERD (gastroesophageal reflux disease)    ? Hearing reduced    ? Heart disease     Stress test, 24 hour holter approximately 1 year ago   ? Hyperlipidemia    ? Hypertension    ? Hyperthyroidism 2001    s/p radioactive iodine    ? NASH (nonalcoholic steatohepatitis)    ? Obesity    ? OSA (obstructive sleep apnea)     does not use CPAP but has available at home   ? PONV (postoperative nausea and vomiting)    ? Post-operative state 12/21/2017   ? Stress incontinence      Past Surgical History:  Surgical History:   Procedure Laterality Date   ? LIVER BIOPSY  2012   ? HX CHOLECYSTECTOMY  2012   ? CATARACT REMOVAL Bilateral 2015   ? COLONOSCOPY  2016   ? HX HYSTERECTOMY  12/08/2017   ? ROBOT ASSISTED LAPAROSCOPIC TOTAL HYSTERECTOMY, BILATERAL SALPINGO-OOPHORECTOMY Bilateral 12/21/2017    Performed by Pernell Dupre, [facility] at [facility] OR   ? IDENTIFICATION SENTINEL LYMPH NODE Bilateral 12/21/2017    Performed by Pernell Dupre, MD at [facility] OR   ? CYSTOURETHROSCOPY N/A 12/21/2017    Performed by Pernell Dupre, MD at [facility] OR   ? LAPAROSCOPIC RETROPERITONEAL LYMPH NODE BIOPSY - MULTIPLE Bilateral 12/21/2017    Performed by Pernell Dupre, [facility] at [facility] OR   ? CORONARY STENT PLACEMENT       Medications:    Current Outpatient Medications:   ?  acetaminophen (TYLENOL) 500 mg tablet, Take 500 mg by mouth every 6 hours as needed for Pain. Max of 4,000 mg of acetaminophen in 24 hours., Disp: , Rfl:   ?  albuterol (VENTOLIN HFA, PROAIR HFA, PROVENTIL HFA) 90 mcg/actuation inhaler, Inhale 2 puffs by mouth into the lungs every 6 hours as needed for Wheezing or Shortness of Breath. Shake well before use., Disp: , Rfl:   ?  ascorbic acid (VITAMIN C) 500 mg tablet, Take 500 mg by mouth daily., Disp: , Rfl:   ?  aspirin-calcium carbonate 81 mg-300 mg calcium(777 mg) tab, 1 Tab, daily, PO, 30 Tab, 0 Number of Refills, 0, Route to Pharmacy Electronically, [facility] - [place], [place], 162.6, cm, 11/25/18 7:24:00 CST, Height, 117, kg, 11/24/18 18:34:00 CST, Admission Weight, Disp: , Rfl:   ?  carvedilol (COREG) 12.5 mg tablet, Take one tablet by mouth twice daily with meals., Disp: 180 tablet, Rfl: 3  ?  cholecalciferol (VITAMIN D-3) 1,000 units tablet, Take 1,000 Units by mouth daily., Disp: , Rfl:   ?  clopiDOGrel (PLAVIX) 75 mg tablet, , Disp: , Rfl:   ?  cyanocobalamin (VITAMIN B-12, RUBRAMIN) 1,000 mcg/mL injection, Inject 1 mL into the muscle every 30 days., Disp: , Rfl:   ?  fish oil /omega-3 fatty acids (SEA-OMEGA) 340/1000 mg capsule, Take 1 capsule by mouth daily., Disp: , Rfl:   ?  furosemide (LASIX) 40 mg tablet, 1 Tab, daily, PO, 90 Tab, 3 Number of Refills, 3, Route to Pharmacy Electronically, [facility], [place], [place], TAB, 162.6, cm, 12/27/18 12:56:00 CDT, Height, 117, kg, 11/24/18 18:34:00 CST, Admission Weight, Disp: , Rfl:   ?  glimepiride (AMARYL) 4 mg tablet, Take 4 mg by mouth daily., Disp: , Rfl:   ?  insulin aspart U-100 (NOVOLOG FLEXPEN U-100 INSULIN) 100 unit/mL (3 mL) injection PEN, Inject 22 Units under the skin. Largest meal of the day , Disp: , Rfl:   ?  insulin detemir(+) (LEVEMIR FLEXTOUCH) 100 unit/mL (3 mL) injection pen, Inject 80 Units under the skin at bedtime daily., Disp: , Rfl:   ?  isosorbide mononitrate SR (IMDUR) 30 mg tablet, Take 30 mg by mouth every morning., Disp: , Rfl:   ?  lactobacillus rhamnosus GG (LACTOBACILLUS RHAMNOSUS (GG)) 15 billion cell cpSP capsule, Take 1 capsule by mouth as directed daily., Disp: , Rfl:   ?  linagliptin 5 mg tab, Take 5 mg by mouth daily., Disp: , Rfl:   ?  magnesium oxide (MAG-OX) 400 mg tablet, Take 400 mg by mouth daily., Disp: , Rfl:   ?  metFORMIN (GLUCOPHAGE) 1,000 mg tablet, Take 1,000 mg by mouth twice daily with meals., Disp: , Rfl:   ?  metoprolol XL (TOPROL XL) 25 mg extended release tablet, Take 25 mg by mouth., Disp: , Rfl:   ?  naproxen sodium(+) (ALEVE) 220 mg tablet, Take 1 tablet by mouth twice daily as needed. Take with food., Disp: , Rfl:   ?  omeprazole DR(+) (PRILOSEC) 40 mg capsule, Take 40 mg by mouth daily before breakfast., Disp: , Rfl:   ?  pantoprazole DR (PROTONIX) 40 mg tablet, , Disp: , Rfl:   ?  PAPAYA ENZYME PO, Take 4 capsules by mouth daily as needed., Disp: , Rfl:   ?  potassium chloride (MICRO-K) 10 mEq capsule, Take 10 mEq by mouth., Disp: , Rfl:   ?  quinapril (ACCUPRIL) 40 mg tablet, Take 40 mg by mouth daily., Disp: , Rfl:   ?  rosuvastatin (CRESTOR) 20 mg tablet, , Disp: , Rfl:   ?  vitamin E 400 unit capsule, Take 400 Units by mouth daily., Disp: , Rfl:   ?  vitamins, B complex tab, Take 1 tablet by mouth daily., Disp: , Rfl:   ?  vitamins, multiple tablet, Take 1 tablet by mouth daily., Disp: , Rfl:     Allergies:  Allergies   Allergen Reactions   ?  Keflex [Cephalexin] ANAPHYLAXIS and HIVES   ? Penicillins ANAPHYLAXIS   ? Latex HIVES and RASH     tape after surgery, gloves worn by providers make her break out    ? Aspirin SEE COMMENTS     Severely constipates her.     Social History:  Social History     Socioeconomic History   ? Marital status: Married     Spouse name: Not on file   ? Number of children: Not on file   ? Years of education: Not on file   ? Highest education level: Not on file   Occupational History   ? Not on file   Tobacco Use   ? Smoking status: Former Smoker     Packs/day: 1.00     Years: 30.00     Pack years: 30.00     Types: Cigarettes     Quit date: 12/07/2008     Years since quitting: 11.3   ? Smokeless tobacco: Never Used   Substance and Sexual Activity   ? Alcohol use: Yes     Alcohol/week: 0.0 standard drinks     Comment: rarely   ? Drug use: No   ? Sexual activity: Not on file   Other Topics Concern   ? Not on file   Social History Narrative   ? Not on file     Family History:  Family History   Problem Relation Age of Onset   ? Diabetes Mother    ? Heart Disease Mother    ? Hypertension Mother    ? Arthritis-osteo Mother    ? Diabetes Father    ? Heart Disease Father    ? Hypertension Father    ? Thyroid Disease Father    ? Diabetes Sister    ? Heart Disease Sister    ? High Cholesterol Sister    ? Arthritis-rheumatoid Sister    ? Migraines Sister    ? Depression Sister    ? Cancer Brother    ? Diabetes Brother    ? Hypertension Brother    ? Arthritis-osteo Brother    ? Arthritis-osteo Maternal Aunt    ? Cancer-Lung Paternal Aunt    ? Cancer Paternal Aunt    ? Diabetes Paternal Aunt    ? Diabetes Paternal Uncle    ? Hypertension Paternal Uncle    ? Cancer-Lung Maternal Grandmother    ? Diabetes Maternal Grandmother    ? Heart Disease Maternal Grandmother    ? Hypertension Maternal Grandmother    ? Arthritis-rheumatoid Maternal Grandmother    ? Diabetes Maternal Grandfather    ? High Cholesterol Paternal Grandmother    ? Diabetes Paternal Grandfather    ? Heart Disease Paternal Grandfather      REVIEW OF SYSTEMS:             CONSTITUTIONAL: Negative unless stated in HPI  EYES: Negative unless stated in HPI  ENT: Negative unless stated in HPI  RESPIRATORY: Negative unless stated in HPI  CARDIOVASCULAR: Negative unless stated in HPI  GI: Negative unless stated in HPI  GU: Negative unless stated in HPI  MUSCULO-SKELETAL: Negative unless stated in HPI  SKIN: Negative unless stated in HPI    ECOG 1     Physical Exam:  BP (!) 151/73 (BP Source: Arm, Left Upper, Patient Position: Sitting)  - Pulse 95  - Temp 36.3 ?C (97.3 ?F) (Temporal)  - Ht 162.6 cm (64)  - Wt 117.7 kg (259 lb 6.4  oz)  - SpO2 96%  - BMI 44.53 kg/m?   GENERAL APPEARANCE: Appears healthy. Alert; in no acute distress.  Pleasant.  HEENT: Unremarkable. No  masses noted.  NECK: Neck supple. No adenopathy.     LUNGS: Chest symmetrical. Good diaphragmatic excursion. Lungs clear; normal breath sounds.  CARDIOVASCULAR: RRR. Heart sounds normal.      ABDOMEN: Abdomen obese, soft, non-tender. No masses, organomegaly, or hernia. No clinical evidence of ascites. Incisions well healed.   PELVIC: Left vulva with benign appearing sebaceous cyst. Red beefy homogenous patch in bilateral inguinal folds.Otherwise anus, perineum, urethral meatus, urethra, bladder and vagina normal. Cervix, uterus, and adnexae are surgically absent. Speculum exam with well approximated vaginal cuff. No erythema, bleeding, abnormal discharge, or lesions noted. Bimanual exam without cuff separation, masses or nodularity on the pelvic floor. Exam chaperoned by nurse    EXTREMITIES: Extremities normal. No joint deformities, edema, or skin discoloration. Station and gait normal.  SKIN: Multiple pasted on appearing lesions to abdomen consistent with seborrheic keratoses. Skin color, texture, turgor normal. No rashes or lesions.  LYMPH NODES: No palpable inguinal lymph nodes.    CBC w/Diff    Lab Results   Component Value Date/Time    WBC 6.0 11/29/2019 11:47 AM    RBC 4.32 11/29/2019 11:47 AM    HGB 12.1 11/29/2019 11:47 AM    HCT 39.3 11/29/2019 11:47 AM    MCV 90.9 11/29/2019 11:47 AM    MCH 28.0 11/29/2019 11:47 AM    MCHC 30.9 11/29/2019 11:47 AM    RDW 14.9 (H) 11/29/2019 11:47 AM    PLTCT 117 (L) 11/29/2019 11:47 AM    MPV 10.0 11/17/2017 01:12 PM    Lab Results   Component Value Date/Time    NEUT 59.2 11/29/2019 11:47 AM    ANC 3.5 11/29/2019 11:47 AM    LYMA 25 11/17/2017 01:12 PM    ALC 1.7 11/29/2019 11:47 AM    MONA 7 11/17/2017 01:12 PM    AMC 0.6 11/29/2019 11:47 AM    EOSA 1 11/17/2017 01:12 PM    AEC 0.1 11/29/2019 11:47 AM    BASA 0 11/17/2017 01:12 PM    ABC 0.0 11/29/2019 11:47 AM          Comprehensive Metabolic Profile    Lab Results   Component Value Date/Time    NA 137 11/29/2019 11:47 AM    K 5.0 11/29/2019 11:47 AM    CL 102 11/29/2019 11:47 AM    CO2 27.0 11/29/2019 11:47 AM    GAP 13 11/29/2019 11:47 AM    BUN 12.0 11/29/2019 11:47 AM    CR 0.88 11/29/2019 11:47 AM    GLU 148 (H) 11/29/2019 11:47 AM    Lab Results   Component Value Date/Time    CA 9.5 11/29/2019 11:47 AM    ALBUMIN 3.8 11/29/2019 11:47 AM    TOTPROT 7.6 11/29/2019 11:47 AM    ALKPHOS 68 11/29/2019 11:47 AM    AST 33 11/29/2019 11:47 AM    ALT 23 11/29/2019 11:47 AM    TOTBILI 0.40 11/29/2019 11:47 AM    GFR 67.5 11/29/2019 11:47 AM    GFRAA >60 11/17/2017 01:12 PM          Other labs    No results found for: ESR No results found for: LDH     ASSESSMENT/PLAN:  EBUNOLUWA MOALA is a 69 y.o. female  with Stage IA endometrial carcinoma, FIGO grade 1.  Patient presents to clinic  today for her 2 years and 4 month cancer surveillance visit.     ? No evidence of disease on exam today.   ? Multiple, chronic, underlying conditions. Continue with close follow up with PCP with routine health maintenance.  ? Intertrigo -- Red beefy homogenous patch in bilateral inguinal folds. Recommended she continue keeping the area clean and dry, and can apply powder as necessary.  ? Recommend annual visits with PCP including updated vaccination and routine screening (mammogram, DEXA and colonoscopy).    ? Discussed possible symptoms of cancer recurrence, such as cough, chest pain, early satiety, abdominal pain/bloating, N/V, vaginal bleeding/discharge or change in bowel/bladder habits, dizziness, HA.    ? Patient given ample opportunity to ask questions, denies further questions at this time.   ? RTC in 1 year with APP or sooner if needed.     Hewitt Blade, PA-C     Total time spent date of service: Greater than 20 minutes, which was spent in preparation for patient visit, review of pertinent history, labs and imaging, coordination of patient care, education and counseling of patient. Discussed the patient's diagnosis, management, and reviewed goals.

## 2020-04-17 ENCOUNTER — Encounter: Admit: 2020-04-17 | Discharge: 2020-04-17 | Payer: MEDICARE

## 2020-04-17 DIAGNOSIS — I519 Heart disease, unspecified: Secondary | ICD-10-CM

## 2020-04-17 DIAGNOSIS — H9190 Unspecified hearing loss, unspecified ear: Secondary | ICD-10-CM

## 2020-04-17 DIAGNOSIS — E669 Obesity, unspecified: Secondary | ICD-10-CM

## 2020-04-17 DIAGNOSIS — Z9889 Other specified postprocedural states: Secondary | ICD-10-CM

## 2020-04-17 DIAGNOSIS — N393 Stress incontinence (female) (male): Secondary | ICD-10-CM

## 2020-04-17 DIAGNOSIS — E538 Deficiency of other specified B group vitamins: Secondary | ICD-10-CM

## 2020-04-17 DIAGNOSIS — I10 Essential (primary) hypertension: Secondary | ICD-10-CM

## 2020-04-17 DIAGNOSIS — E0590 Thyrotoxicosis, unspecified without thyrotoxic crisis or storm: Secondary | ICD-10-CM

## 2020-04-17 DIAGNOSIS — M549 Dorsalgia, unspecified: Secondary | ICD-10-CM

## 2020-04-17 DIAGNOSIS — C541 Malignant neoplasm of endometrium: Secondary | ICD-10-CM

## 2020-04-17 DIAGNOSIS — K7581 Nonalcoholic steatohepatitis (NASH): Secondary | ICD-10-CM

## 2020-04-17 DIAGNOSIS — M1990 Unspecified osteoarthritis, unspecified site: Secondary | ICD-10-CM

## 2020-04-17 DIAGNOSIS — K219 Gastro-esophageal reflux disease without esophagitis: Secondary | ICD-10-CM

## 2020-04-17 DIAGNOSIS — K7460 Unspecified cirrhosis of liver: Secondary | ICD-10-CM

## 2020-04-17 DIAGNOSIS — E785 Hyperlipidemia, unspecified: Secondary | ICD-10-CM

## 2020-04-17 DIAGNOSIS — E119 Type 2 diabetes mellitus without complications: Secondary | ICD-10-CM

## 2020-04-17 DIAGNOSIS — G4733 Obstructive sleep apnea (adult) (pediatric): Secondary | ICD-10-CM

## 2020-04-17 DIAGNOSIS — R112 Nausea with vomiting, unspecified: Secondary | ICD-10-CM

## 2020-04-17 DIAGNOSIS — L304 Erythema intertrigo: Secondary | ICD-10-CM

## 2020-05-03 ENCOUNTER — Encounter: Admit: 2020-05-03 | Discharge: 2020-05-03 | Payer: MEDICARE

## 2020-05-03 NOTE — Telephone Encounter
Patient calling to speak with the nurse regarding labs , and Ultrasound .Patient states that she was told to get labs with ultrasound every 6 months .

## 2020-05-03 NOTE — Telephone Encounter
Returned call. Discussed that pt is due for labs and [place] in September. Pt states she is getting labs drawn at [facility] soon and would like to complete Dr. Telford Nab requested labs and [place] at that time.    Labs and [place] faxed to [facility] [phone]

## 2020-05-10 ENCOUNTER — Encounter: Admit: 2020-05-10 | Discharge: 2020-05-10 | Payer: MEDICARE

## 2020-05-10 DIAGNOSIS — K7460 Unspecified cirrhosis of liver: Secondary | ICD-10-CM

## 2020-05-10 DIAGNOSIS — K7581 Nonalcoholic steatohepatitis (NASH): Secondary | ICD-10-CM

## 2020-05-10 LAB — COMPREHENSIVE METABOLIC PANEL
Lab: 0.4 mg/dL (ref 0.2–1.2)
Lab: 0.9 % (ref 11.5–14.5)
Lab: 10 mg/dL (ref 8.4–10.2)
Lab: 12 % (ref 9.8–20.1)
Lab: 13 pg (ref 27.0–31.0)
Lab: 136 % — ABNORMAL HIGH (ref 70–105)
Lab: 138 mmol/L (ref 136–145)
Lab: 21 U/L (ref 0–55)
Lab: 28 (ref 23–31)
Lab: 3.7 g/dL (ref 3.4–4.8)
Lab: 30 U/L (ref 5–34)
Lab: 61 10^3/uL — ABNORMAL LOW (ref >59)
Lab: 7.5 g/dL (ref 6.2–8.1)
Lab: 74 U/L (ref 40–150)

## 2020-05-10 LAB — PROTIME INR (PT)
Lab: 1.1 % (ref 0.89–1.11)
Lab: 12 s (ref 10–12.4)

## 2020-05-10 LAB — CBC AND DIFF
Lab: 0.1
Lab: 4.9
Lab: 0.1 10*3/uL (ref 0.0–0.6)

## 2020-05-16 ENCOUNTER — Encounter: Admit: 2020-05-16 | Discharge: 2020-05-16 | Payer: MEDICARE

## 2020-05-17 ENCOUNTER — Encounter: Admit: 2020-05-17 | Discharge: 2020-05-17 | Payer: MEDICARE

## 2020-05-17 DIAGNOSIS — K7581 Nonalcoholic steatohepatitis (NASH): Secondary | ICD-10-CM

## 2020-05-17 DIAGNOSIS — K7460 Unspecified cirrhosis of liver: Secondary | ICD-10-CM

## 2020-07-02 ENCOUNTER — Encounter: Admit: 2020-07-02 | Discharge: 2020-07-02 | Payer: MEDICARE

## 2020-11-02 ENCOUNTER — Encounter: Admit: 2020-11-02 | Discharge: 2020-11-02 | Payer: MEDICARE

## 2020-11-02 DIAGNOSIS — E1169 Type 2 diabetes mellitus with other specified complication: Secondary | ICD-10-CM

## 2020-11-02 DIAGNOSIS — K7581 Nonalcoholic steatohepatitis (NASH): Secondary | ICD-10-CM

## 2020-11-02 DIAGNOSIS — K7460 Unspecified cirrhosis of liver: Secondary | ICD-10-CM

## 2020-11-02 NOTE — Progress Notes
Faxed patient Lab and Radiology orders to [facility] per patient request. Shawnie Pons

## 2020-11-02 NOTE — Telephone Encounter
Pt called requesting lab and [place] orders to be sent to [facility] in [place], [place]. Bobi said she will fax them over.

## 2020-11-15 ENCOUNTER — Encounter

## 2020-11-15 DIAGNOSIS — E1169 Type 2 diabetes mellitus with other specified complication: Secondary | ICD-10-CM

## 2020-11-15 DIAGNOSIS — K7581 Nonalcoholic steatohepatitis (NASH): Secondary | ICD-10-CM

## 2020-11-15 DIAGNOSIS — K7460 Unspecified cirrhosis of liver: Secondary | ICD-10-CM

## 2020-11-15 LAB — HEMOGLOBIN A1C: Lab: 7.3

## 2020-11-15 LAB — PROTIME INR (PT)
Lab: 1.1 — ABNORMAL LOW (ref 12.0–16.0)
Lab: 12 s — ABNORMAL HIGH (ref 10–12.4)

## 2020-11-15 LAB — CBC AND DIFF
Lab: 120 — ABNORMAL LOW (ref 130–400)
Lab: 15 — ABNORMAL HIGH (ref 11.5–14.5)
Lab: 28 mg/dL
Lab: 32
Lab: 35 — ABNORMAL LOW (ref 37.0–47.0)
Lab: 4.2 — ABNORMAL LOW (ref 4.8–10.8)
Lab: 86

## 2020-11-15 LAB — COMPREHENSIVE METABOLIC PANEL
Lab: 0.4 mg/dL
Lab: 136 mmol/L
Lab: 26
Lab: 4
Lab: 46 — ABNORMAL HIGH (ref 5–34)
Lab: 7.5
Lab: 80

## 2020-11-20 ENCOUNTER — Encounter: Admit: 2020-11-20 | Discharge: 2020-11-20 | Payer: MEDICARE

## 2020-11-20 DIAGNOSIS — K7581 Nonalcoholic steatohepatitis (NASH): Secondary | ICD-10-CM

## 2020-11-20 DIAGNOSIS — K7460 Unspecified cirrhosis of liver: Secondary | ICD-10-CM

## 2020-11-20 LAB — 25-OH VITAMIN D (D2 + D3): Lab: 29 % — ABNORMAL LOW (ref 30–100)

## 2020-11-20 LAB — ALPHA FETO PROTEIN (AFP): Lab: 3.3 g/dL — ABNORMAL LOW (ref 13.5–16.5)

## 2020-12-04 ENCOUNTER — Encounter: Admit: 2020-12-04 | Discharge: 2020-12-04 | Payer: MEDICARE

## 2020-12-06 ENCOUNTER — Encounter: Admit: 2020-12-06 | Discharge: 2020-12-06 | Payer: MEDICARE

## 2020-12-06 DIAGNOSIS — K7460 Unspecified cirrhosis of liver: Secondary | ICD-10-CM

## 2020-12-06 DIAGNOSIS — K7581 Nonalcoholic steatohepatitis (NASH): Secondary | ICD-10-CM

## 2020-12-18 ENCOUNTER — Encounter: Admit: 2020-12-18 | Discharge: 2020-12-18 | Payer: MEDICARE

## 2020-12-18 ENCOUNTER — Ambulatory Visit: Admit: 2020-12-18 | Discharge: 2020-12-19 | Payer: MEDICARE

## 2020-12-18 DIAGNOSIS — E785 Hyperlipidemia, unspecified: Secondary | ICD-10-CM

## 2020-12-18 DIAGNOSIS — E538 Deficiency of other specified B group vitamins: Secondary | ICD-10-CM

## 2020-12-18 DIAGNOSIS — M1990 Unspecified osteoarthritis, unspecified site: Secondary | ICD-10-CM

## 2020-12-18 DIAGNOSIS — M549 Dorsalgia, unspecified: Secondary | ICD-10-CM

## 2020-12-18 DIAGNOSIS — K219 Gastro-esophageal reflux disease without esophagitis: Secondary | ICD-10-CM

## 2020-12-18 DIAGNOSIS — E119 Type 2 diabetes mellitus without complications: Secondary | ICD-10-CM

## 2020-12-18 DIAGNOSIS — C541 Malignant neoplasm of endometrium: Secondary | ICD-10-CM

## 2020-12-18 DIAGNOSIS — Z9889 Other specified postprocedural states: Secondary | ICD-10-CM

## 2020-12-18 DIAGNOSIS — R112 Nausea with vomiting, unspecified: Secondary | ICD-10-CM

## 2020-12-18 DIAGNOSIS — I519 Heart disease, unspecified: Secondary | ICD-10-CM

## 2020-12-18 DIAGNOSIS — G4733 Obstructive sleep apnea (adult) (pediatric): Secondary | ICD-10-CM

## 2020-12-18 DIAGNOSIS — H9190 Unspecified hearing loss, unspecified ear: Secondary | ICD-10-CM

## 2020-12-18 DIAGNOSIS — E669 Obesity, unspecified: Secondary | ICD-10-CM

## 2020-12-18 DIAGNOSIS — E0590 Thyrotoxicosis, unspecified without thyrotoxic crisis or storm: Secondary | ICD-10-CM

## 2020-12-18 DIAGNOSIS — K7460 Unspecified cirrhosis of liver: Secondary | ICD-10-CM

## 2020-12-18 DIAGNOSIS — K7581 Nonalcoholic steatohepatitis (NASH): Secondary | ICD-10-CM

## 2020-12-18 DIAGNOSIS — N393 Stress incontinence (female) (male): Secondary | ICD-10-CM

## 2020-12-18 DIAGNOSIS — I10 Essential (primary) hypertension: Secondary | ICD-10-CM

## 2020-12-19 DIAGNOSIS — K7460 Unspecified cirrhosis of liver: Secondary | ICD-10-CM

## 2020-12-19 DIAGNOSIS — E559 Vitamin D deficiency, unspecified: Secondary | ICD-10-CM

## 2021-01-11 ENCOUNTER — Encounter: Admit: 2021-01-11 | Discharge: 2021-01-11 | Payer: MEDICARE

## 2021-01-11 ENCOUNTER — Inpatient Hospital Stay: Admit: 2021-01-11 | Payer: MEDICARE

## 2021-01-11 ENCOUNTER — Inpatient Hospital Stay: Admit: 2021-01-11 | Discharge: 2021-01-11 | Payer: MEDICARE

## 2021-01-11 DIAGNOSIS — K353 Acute appendicitis with localized peritonitis, without perforation or gangrene: Secondary | ICD-10-CM

## 2021-01-11 DIAGNOSIS — I519 Heart disease, unspecified: Secondary | ICD-10-CM

## 2021-01-11 DIAGNOSIS — K37 Unspecified appendicitis: Secondary | ICD-10-CM

## 2021-01-11 DIAGNOSIS — M549 Dorsalgia, unspecified: Secondary | ICD-10-CM

## 2021-01-11 DIAGNOSIS — K7460 Unspecified cirrhosis of liver: Secondary | ICD-10-CM

## 2021-01-11 DIAGNOSIS — E0590 Thyrotoxicosis, unspecified without thyrotoxic crisis or storm: Secondary | ICD-10-CM

## 2021-01-11 DIAGNOSIS — E669 Obesity, unspecified: Secondary | ICD-10-CM

## 2021-01-11 DIAGNOSIS — N393 Stress incontinence (female) (male): Secondary | ICD-10-CM

## 2021-01-11 DIAGNOSIS — R112 Nausea with vomiting, unspecified: Secondary | ICD-10-CM

## 2021-01-11 DIAGNOSIS — M1990 Unspecified osteoarthritis, unspecified site: Secondary | ICD-10-CM

## 2021-01-11 DIAGNOSIS — I10 Essential (primary) hypertension: Secondary | ICD-10-CM

## 2021-01-11 DIAGNOSIS — G4733 Obstructive sleep apnea (adult) (pediatric): Secondary | ICD-10-CM

## 2021-01-11 DIAGNOSIS — K7581 Nonalcoholic steatohepatitis (NASH): Secondary | ICD-10-CM

## 2021-01-11 DIAGNOSIS — H9190 Unspecified hearing loss, unspecified ear: Secondary | ICD-10-CM

## 2021-01-11 DIAGNOSIS — E119 Type 2 diabetes mellitus without complications: Secondary | ICD-10-CM

## 2021-01-11 DIAGNOSIS — Z9889 Other specified postprocedural states: Secondary | ICD-10-CM

## 2021-01-11 DIAGNOSIS — K219 Gastro-esophageal reflux disease without esophagitis: Secondary | ICD-10-CM

## 2021-01-11 DIAGNOSIS — C541 Malignant neoplasm of endometrium: Secondary | ICD-10-CM

## 2021-01-11 DIAGNOSIS — E785 Hyperlipidemia, unspecified: Secondary | ICD-10-CM

## 2021-01-11 DIAGNOSIS — E538 Deficiency of other specified B group vitamins: Secondary | ICD-10-CM

## 2021-01-11 LAB — CBC
Lab: 11 g/dL — ABNORMAL LOW (ref 12.0–15.0)
Lab: 123 K/UL — ABNORMAL LOW (ref 150–400)
Lab: 17 % — ABNORMAL HIGH (ref 11–15)
Lab: 34 % — ABNORMAL LOW (ref 36–45)
Lab: 4 M/UL (ref 4.0–5.0)
Lab: 7 K/UL (ref 4.5–11.0)
Lab: 9.5 FL (ref 7–11)

## 2021-01-11 LAB — COMPREHENSIVE METABOLIC PANEL
Lab: 0.8 mg/dL (ref 0.4–1.00)
Lab: 11 (ref 3–12)
Lab: 116 mg/dL — ABNORMAL HIGH (ref 70–100)
Lab: 135 MMOL/L — ABNORMAL LOW (ref 137–147)
Lab: 17 U/L (ref 7–56)
Lab: 23 MMOL/L (ref 21–30)
Lab: 3.7 g/dL (ref 3.5–5.0)
Lab: 37 U/L (ref 7–40)
Lab: 5.2 MMOL/L — ABNORMAL HIGH (ref 3.5–5.1)
Lab: 6.9 g/dL (ref 6.0–8.0)
Lab: 68 U/L (ref 25–110)
Lab: 8.3 mg/dL — ABNORMAL LOW (ref 8.5–10.6)
Lab: >60 mL/min (ref >60)

## 2021-01-11 LAB — LACTIC ACID(LACTATE): Lab: 1.2 MMOL/L (ref 0.5–2.0)

## 2021-01-11 LAB — PHOSPHORUS: Lab: 4.2 mg/dL (ref 2.0–4.5)

## 2021-01-11 LAB — PTT (APTT): Lab: 32 s (ref 24.0–36.5)

## 2021-01-11 LAB — PROTIME INR (PT): Lab: 13 s (ref 8.5–14.4)

## 2021-01-11 LAB — POC GLUCOSE: Lab: 119 mg/dL — ABNORMAL HIGH (ref 70–100)

## 2021-01-11 LAB — MAGNESIUM: Lab: 1.7 mg/dL (ref 1.6–2.6)

## 2021-01-11 MED ORDER — CLOPIDOGREL 75 MG PO TAB
75 mg | Freq: Every day | ORAL | 0 refills | Status: AC
Start: 2021-01-11 — End: ?

## 2021-01-11 MED ORDER — OXYCODONE 5 MG PO TAB
5-10 mg | Freq: Once | ORAL | 0 refills | PRN
Start: 2021-01-11 — End: ?
  Administered 2021-01-12: 08:00:00 10 mg via ORAL

## 2021-01-11 MED ORDER — SODIUM CHLORIDE 0.9 % IJ SOLN
50 mL | Freq: Once | INTRAVENOUS | 0 refills | Status: CP
Start: 2021-01-11 — End: ?
  Administered 2021-01-12: 03:00:00 50 mL via INTRAVENOUS

## 2021-01-11 MED ORDER — ONDANSETRON HCL (PF) 4 MG/2 ML IJ SOLN
4 mg | INTRAVENOUS | 0 refills | Status: AC | PRN
Start: 2021-01-11 — End: ?

## 2021-01-11 MED ORDER — FENTANYL CITRATE (PF) 50 MCG/ML IJ SOLN
50 ug | INTRAVENOUS | 0 refills | PRN
Start: 2021-01-11 — End: ?
  Administered 2021-01-12 (×2): 50 ug via INTRAVENOUS

## 2021-01-11 MED ORDER — IOHEXOL 350 MG IODINE/ML IV SOLN
100 mL | Freq: Once | INTRAVENOUS | 0 refills | Status: CP
Start: 2021-01-11 — End: ?
  Administered 2021-01-12: 03:00:00 100 mL via INTRAVENOUS

## 2021-01-11 MED ORDER — METOPROLOL SUCCINATE 25 MG PO TB24
25 mg | Freq: Every day | ORAL | 0 refills | Status: AC
Start: 2021-01-11 — End: ?
  Administered 2021-01-12 – 2021-01-13 (×2): 25 mg via ORAL

## 2021-01-11 MED ORDER — LACTATED RINGERS IV SOLP
INTRAVENOUS | 0 refills | Status: AC
Start: 2021-01-11 — End: ?
  Administered 2021-01-12: 03:00:00 1000.000 mL via INTRAVENOUS

## 2021-01-11 MED ORDER — ONDANSETRON 4 MG PO TBDI
4 mg | ORAL | 0 refills | Status: AC | PRN
Start: 2021-01-11 — End: ?

## 2021-01-11 MED ORDER — FENTANYL CITRATE (PF) 50 MCG/ML IJ SOLN
25-50 ug | INTRAVENOUS | 0 refills | Status: AC | PRN
Start: 2021-01-11 — End: ?

## 2021-01-11 MED ORDER — DIPHENHYDRAMINE HCL 50 MG/ML IJ SOLN
25 mg | Freq: Once | INTRAVENOUS | 0 refills | PRN
Start: 2021-01-11 — End: ?
  Administered 2021-01-12: 08:00:00 25 mg via INTRAVENOUS

## 2021-01-11 MED ORDER — SENNOSIDES-DOCUSATE SODIUM 8.6-50 MG PO TAB
1 | Freq: Every day | ORAL | 0 refills | Status: AC | PRN
Start: 2021-01-11 — End: ?

## 2021-01-11 MED ORDER — OXYCODONE 5 MG PO TAB
5-15 mg | ORAL | 0 refills | Status: AC | PRN
Start: 2021-01-11 — End: ?

## 2021-01-11 MED ORDER — ACETAMINOPHEN 325 MG PO TAB
650 mg | ORAL | 0 refills | Status: AC
Start: 2021-01-11 — End: ?
  Administered 2021-01-12 – 2021-01-13 (×7): 650 mg via ORAL

## 2021-01-11 MED ORDER — ERTAPENEM 1GM IVP
1 g | Freq: Once | INTRAVENOUS | 0 refills | Status: CP
Start: 2021-01-11 — End: ?
  Administered 2021-01-12: 04:00:00 1 g via INTRAVENOUS

## 2021-01-11 MED ORDER — LACTATED RINGERS IV SOLP
1000 mL | INTRAVENOUS | 0 refills | Status: AC
Start: 2021-01-11 — End: ?
  Administered 2021-01-12: 06:00:00 1000.000 mL via INTRAVENOUS

## 2021-01-11 MED ORDER — INSULIN ASPART 100 UNIT/ML SC FLEXPEN
0-24 [IU] | Freq: Before meals | SUBCUTANEOUS | 0 refills | Status: AC
Start: 2021-01-11 — End: ?
  Administered 2021-01-12: 14:00:00 4 [IU] via SUBCUTANEOUS

## 2021-01-11 MED ORDER — PANTOPRAZOLE 40 MG PO TBEC
40 mg | Freq: Every day | ORAL | 0 refills | Status: AC
Start: 2021-01-11 — End: ?
  Administered 2021-01-12 – 2021-01-13 (×2): 40 mg via ORAL

## 2021-01-11 MED ORDER — MELATONIN 5 MG PO TAB
5 mg | Freq: Every evening | ORAL | 0 refills | Status: AC | PRN
Start: 2021-01-11 — End: ?

## 2021-01-11 MED ORDER — ENOXAPARIN 40 MG/0.4 ML SC SYRG
40 mg | Freq: Every day | SUBCUTANEOUS | 0 refills | Status: AC
Start: 2021-01-11 — End: ?
  Administered 2021-01-13: 02:00:00 40 mg via SUBCUTANEOUS

## 2021-01-11 MED ORDER — POLYETHYLENE GLYCOL 3350 17 GRAM PO PWPK
1 | Freq: Every day | ORAL | 0 refills | Status: AC | PRN
Start: 2021-01-11 — End: ?

## 2021-01-11 MED ORDER — FENTANYL CITRATE (PF) 50 MCG/ML IJ SOLN
25 ug | INTRAVENOUS | 0 refills | PRN
Start: 2021-01-11 — End: ?

## 2021-01-11 MED ORDER — HALOPERIDOL LACTATE 5 MG/ML IJ SOLN
1 mg | Freq: Once | INTRAVENOUS | 0 refills | PRN
Start: 2021-01-11 — End: ?
  Administered 2021-01-12: 07:00:00 1 mg via INTRAVENOUS

## 2021-01-11 MED ORDER — HYDROMORPHONE (PF) 2 MG/ML IJ SYRG
.5 mg | INTRAVENOUS | 0 refills | PRN
Start: 2021-01-11 — End: ?
  Administered 2021-01-12: 08:00:00 0.5 mg via INTRAVENOUS

## 2021-01-11 MED ORDER — CARVEDILOL 12.5 MG PO TAB
12.5 mg | Freq: Two times a day (BID) | ORAL | 0 refills | Status: DC
Start: 2021-01-11 — End: 2021-01-12

## 2021-01-11 MED ORDER — LABETALOL 5 MG/ML IV SYRG
20 mg | Freq: Once | INTRAVENOUS | 0 refills | Status: CP
Start: 2021-01-11 — End: ?
  Administered 2021-01-12: 01:00:00 20 mg via INTRAVENOUS

## 2021-01-11 NOTE — Progress Notes
70 yo female pt presented to osh with c/o 2 day history of abdominal pain.  States pain started periumbilical and is now localized in the RLQ.  CT scan revealed an appendicitis.  Sending provider states that they do not have surgical coverage this weekend.  Pt with normal WBC and normal CMP.  Lactic acid 1.3, +UTI.  Pmh HTN, DM, CAD, COPD.  Pt alert and oriented x 3.  VSS.

## 2021-01-12 ENCOUNTER — Encounter: Admit: 2021-01-12 | Discharge: 2021-01-12 | Payer: MEDICARE

## 2021-01-12 ENCOUNTER — Inpatient Hospital Stay: Admit: 2021-01-12 | Discharge: 2021-01-11 | Payer: MEDICARE

## 2021-01-12 DIAGNOSIS — K219 Gastro-esophageal reflux disease without esophagitis: Secondary | ICD-10-CM

## 2021-01-12 DIAGNOSIS — K7460 Unspecified cirrhosis of liver: Secondary | ICD-10-CM

## 2021-01-12 DIAGNOSIS — E538 Deficiency of other specified B group vitamins: Secondary | ICD-10-CM

## 2021-01-12 DIAGNOSIS — E669 Obesity, unspecified: Secondary | ICD-10-CM

## 2021-01-12 DIAGNOSIS — I519 Heart disease, unspecified: Secondary | ICD-10-CM

## 2021-01-12 DIAGNOSIS — K7581 Nonalcoholic steatohepatitis (NASH): Secondary | ICD-10-CM

## 2021-01-12 DIAGNOSIS — E785 Hyperlipidemia, unspecified: Secondary | ICD-10-CM

## 2021-01-12 DIAGNOSIS — M1990 Unspecified osteoarthritis, unspecified site: Secondary | ICD-10-CM

## 2021-01-12 DIAGNOSIS — E119 Type 2 diabetes mellitus without complications: Secondary | ICD-10-CM

## 2021-01-12 DIAGNOSIS — N393 Stress incontinence (female) (male): Secondary | ICD-10-CM

## 2021-01-12 DIAGNOSIS — M549 Dorsalgia, unspecified: Secondary | ICD-10-CM

## 2021-01-12 DIAGNOSIS — E0590 Thyrotoxicosis, unspecified without thyrotoxic crisis or storm: Secondary | ICD-10-CM

## 2021-01-12 DIAGNOSIS — C541 Malignant neoplasm of endometrium: Secondary | ICD-10-CM

## 2021-01-12 DIAGNOSIS — H9190 Unspecified hearing loss, unspecified ear: Secondary | ICD-10-CM

## 2021-01-12 DIAGNOSIS — G4733 Obstructive sleep apnea (adult) (pediatric): Secondary | ICD-10-CM

## 2021-01-12 DIAGNOSIS — I10 Essential (primary) hypertension: Secondary | ICD-10-CM

## 2021-01-12 DIAGNOSIS — Z9889 Other specified postprocedural states: Secondary | ICD-10-CM

## 2021-01-12 DIAGNOSIS — R112 Nausea with vomiting, unspecified: Secondary | ICD-10-CM

## 2021-01-12 MED ADMIN — PHENYLEPHRINE HCL IN 0.9% NACL 1 MG/10 ML (100 MCG/ML) IV SYRG [303060]: 100 ug | INTRAVENOUS | @ 06:00:00 | Stop: 2021-01-12 | NDC 69374095710

## 2021-01-12 MED ADMIN — PROPOFOL INJ 10 MG/ML IV VIAL [210331]: 25 ug/kg/min | INTRAVENOUS | @ 06:00:00 | Stop: 2021-01-12 | NDC 25021060820

## 2021-01-12 MED ADMIN — FENTANYL CITRATE (PF) 50 MCG/ML IJ SOLN [3037]: 50 ug | INTRAVENOUS | @ 06:00:00 | Stop: 2021-01-12 | NDC 00409909425

## 2021-01-12 MED ADMIN — SUGAMMADEX 100 MG/ML IV SOLN [328079]: 250 mg | INTRAVENOUS | @ 07:00:00 | Stop: 2021-01-12 | NDC 00006542312

## 2021-01-12 MED ADMIN — PHENYLEPHRINE HCL IN 0.9% NACL 1 MG/10 ML (100 MCG/ML) IV SYRG [303060]: 100 ug | INTRAVENOUS | @ 07:00:00 | Stop: 2021-01-12 | NDC 69374095710

## 2021-01-12 MED ADMIN — LIDOCAINE (PF) 200 MG/10 ML (2 %) IJ SYRG [307651]: 100 mg | INTRAVENOUS | @ 06:00:00 | Stop: 2021-01-12 | NDC 71019017323

## 2021-01-12 MED ADMIN — ROCURONIUM 10 MG/ML IV SOLN [78847]: 10 mg | INTRAVENOUS | @ 06:00:00 | Stop: 2021-01-12 | NDC 00409955810

## 2021-01-12 MED ADMIN — BUPIVACAINE HCL 0.5 % (5 MG/ML) IJ SOLN [1223]: 10 mL | INTRAMUSCULAR | @ 07:00:00 | Stop: 2021-01-12 | NDC 00409116318

## 2021-01-12 MED ADMIN — ROCURONIUM 10 MG/ML IV SOLN [78847]: 50 mg | INTRAVENOUS | @ 06:00:00 | Stop: 2021-01-12 | NDC 00409955810

## 2021-01-12 MED ADMIN — ARTIFICIAL TEARS SINGLE DOSE DROPS GROUP [280009]: 2 [drp] | OPHTHALMIC | @ 06:00:00 | Stop: 2021-01-12 | NDC 00065806301

## 2021-01-12 MED ADMIN — ONDANSETRON HCL (PF) 4 MG/2 ML IJ SOLN [136012]: 4 mg | INTRAVENOUS | @ 07:00:00 | Stop: 2021-01-12 | NDC 25021077702

## 2021-01-12 MED ADMIN — PROPOFOL INJ 10 MG/ML IV VIAL [210331]: 100 mg | INTRAVENOUS | @ 06:00:00 | Stop: 2021-01-12 | NDC 25021060820

## 2021-01-12 MED ADMIN — OXYCODONE 5 MG PO TAB [10814]: 5 mg | ORAL | @ 23:00:00 | NDC 00406055223

## 2021-01-13 ENCOUNTER — Encounter: Admit: 2021-01-13 | Discharge: 2021-01-13 | Payer: MEDICARE

## 2021-01-13 DIAGNOSIS — I10 Essential (primary) hypertension: Secondary | ICD-10-CM

## 2021-01-13 DIAGNOSIS — E538 Deficiency of other specified B group vitamins: Secondary | ICD-10-CM

## 2021-01-13 DIAGNOSIS — K7460 Unspecified cirrhosis of liver: Secondary | ICD-10-CM

## 2021-01-13 DIAGNOSIS — E0590 Thyrotoxicosis, unspecified without thyrotoxic crisis or storm: Secondary | ICD-10-CM

## 2021-01-13 DIAGNOSIS — G4733 Obstructive sleep apnea (adult) (pediatric): Secondary | ICD-10-CM

## 2021-01-13 DIAGNOSIS — M549 Dorsalgia, unspecified: Secondary | ICD-10-CM

## 2021-01-13 DIAGNOSIS — E119 Type 2 diabetes mellitus without complications: Secondary | ICD-10-CM

## 2021-01-13 DIAGNOSIS — K219 Gastro-esophageal reflux disease without esophagitis: Secondary | ICD-10-CM

## 2021-01-13 DIAGNOSIS — K7581 Nonalcoholic steatohepatitis (NASH): Secondary | ICD-10-CM

## 2021-01-13 DIAGNOSIS — E669 Obesity, unspecified: Secondary | ICD-10-CM

## 2021-01-13 DIAGNOSIS — C541 Malignant neoplasm of endometrium: Secondary | ICD-10-CM

## 2021-01-13 DIAGNOSIS — H9190 Unspecified hearing loss, unspecified ear: Secondary | ICD-10-CM

## 2021-01-13 DIAGNOSIS — E785 Hyperlipidemia, unspecified: Secondary | ICD-10-CM

## 2021-01-13 DIAGNOSIS — M1990 Unspecified osteoarthritis, unspecified site: Secondary | ICD-10-CM

## 2021-01-13 DIAGNOSIS — R112 Nausea with vomiting, unspecified: Secondary | ICD-10-CM

## 2021-01-13 DIAGNOSIS — N393 Stress incontinence (female) (male): Secondary | ICD-10-CM

## 2021-01-13 DIAGNOSIS — I519 Heart disease, unspecified: Secondary | ICD-10-CM

## 2021-01-13 DIAGNOSIS — Z9889 Other specified postprocedural states: Secondary | ICD-10-CM

## 2021-01-13 MED ADMIN — FUROSEMIDE 20 MG PO TAB [3294]: 40 mg | ORAL | @ 13:00:00 | Stop: 2021-01-13 | NDC 51079007201

## 2021-01-13 MED ADMIN — OXYCODONE 5 MG PO TAB [10814]: 10 mg | ORAL | @ 13:00:00 | Stop: 2021-01-13 | NDC 00406055223

## 2021-01-13 MED ADMIN — OXYCODONE 5 MG PO TAB [10814]: 5 mg | ORAL | @ 03:00:00 | NDC 00406055223

## 2021-01-13 MED ADMIN — OXYCODONE 5 MG PO TAB [10814]: 10 mg | ORAL | @ 18:00:00 | Stop: 2021-01-13 | NDC 00406055223

## 2021-01-13 MED ADMIN — ISOSORBIDE MONONITRATE 30 MG PO TB24 [24521]: 30 mg | ORAL | @ 13:00:00 | Stop: 2021-01-13 | NDC 00904644961

## 2021-01-13 MED ADMIN — ROSUVASTATIN 20 MG PO TAB [88504]: 20 mg | ORAL | @ 13:00:00 | Stop: 2021-01-13 | NDC 00904678061

## 2021-01-13 MED ADMIN — SIMETHICONE 80 MG PO CHEW [7227]: 80 mg | ORAL | @ 03:00:00 | NDC 70000043401

## 2021-01-13 MED ADMIN — MAGNESIUM OXIDE 400 MG (241.3 MG MAGNESIUM) PO TAB [10491]: 800 mg | ORAL | @ 18:00:00 | Stop: 2021-01-13 | NDC 10006070028

## 2021-01-13 MED ADMIN — ALBUTEROL SULFATE 90 MCG/ACTUATION IN HFAA [40196]: 2 | RESPIRATORY_TRACT | @ 11:00:00 | Stop: 2021-01-13 | NDC 66993001968

## 2021-01-13 MED FILL — OXYCODONE 5 MG PO TAB: 5 mg | ORAL | 4 days supply | Qty: 20 | Fill #1 | Status: CP

## 2021-01-31 ENCOUNTER — Encounter: Admit: 2021-01-31 | Discharge: 2021-01-31 | Payer: MEDICARE

## 2021-01-31 ENCOUNTER — Ambulatory Visit: Admit: 2021-01-31 | Discharge: 2021-02-01 | Payer: MEDICARE

## 2021-01-31 DIAGNOSIS — C541 Malignant neoplasm of endometrium: Secondary | ICD-10-CM

## 2021-01-31 DIAGNOSIS — K7581 Nonalcoholic steatohepatitis (NASH): Secondary | ICD-10-CM

## 2021-01-31 DIAGNOSIS — M549 Dorsalgia, unspecified: Secondary | ICD-10-CM

## 2021-01-31 DIAGNOSIS — G4733 Obstructive sleep apnea (adult) (pediatric): Secondary | ICD-10-CM

## 2021-01-31 DIAGNOSIS — K219 Gastro-esophageal reflux disease without esophagitis: Secondary | ICD-10-CM

## 2021-01-31 DIAGNOSIS — N393 Stress incontinence (female) (male): Secondary | ICD-10-CM

## 2021-01-31 DIAGNOSIS — R112 Nausea with vomiting, unspecified: Secondary | ICD-10-CM

## 2021-01-31 DIAGNOSIS — I519 Heart disease, unspecified: Secondary | ICD-10-CM

## 2021-01-31 DIAGNOSIS — H9190 Unspecified hearing loss, unspecified ear: Secondary | ICD-10-CM

## 2021-01-31 DIAGNOSIS — E0590 Thyrotoxicosis, unspecified without thyrotoxic crisis or storm: Secondary | ICD-10-CM

## 2021-01-31 DIAGNOSIS — K7460 Unspecified cirrhosis of liver: Secondary | ICD-10-CM

## 2021-01-31 DIAGNOSIS — E669 Obesity, unspecified: Secondary | ICD-10-CM

## 2021-01-31 DIAGNOSIS — Z9889 Other specified postprocedural states: Secondary | ICD-10-CM

## 2021-01-31 DIAGNOSIS — I10 Essential (primary) hypertension: Secondary | ICD-10-CM

## 2021-01-31 DIAGNOSIS — E538 Deficiency of other specified B group vitamins: Secondary | ICD-10-CM

## 2021-01-31 DIAGNOSIS — E119 Type 2 diabetes mellitus without complications: Secondary | ICD-10-CM

## 2021-01-31 DIAGNOSIS — M1990 Unspecified osteoarthritis, unspecified site: Secondary | ICD-10-CM

## 2021-01-31 DIAGNOSIS — E785 Hyperlipidemia, unspecified: Secondary | ICD-10-CM

## 2021-01-31 NOTE — Patient Instructions
Please contact our clinic if you have any questions or concerns. Call us at [phone] or send an email through the MyChart system if you have non-urgent concerns. We will get back to you as soon as possible. For urgent or after hours needs, please call [phone] and ask for the ACS on call provider.     NOTE: MyChart messages and phone calls received on weekends, on holidays, and after 4 pm on weekdays will NOT be seen until the following business day. If you have an urgent matter during these times, please call [phone] to reach the on-call team.     If you need prescription refills, please contact your pharmacy.     Our fax number is [phone]. Please be sure your name and date of birth are on any forms that are sent to us for completion. We will have them filled out and signed as soon as possible, but our providers are not in clinic every day, so it can take up to a week.     Lab and test results:  As a part of the CARES act, starting 12/29/2019, some results will be released to you via MyChart immediately and automatically.  You may see results before your provider sees them; however, your provider will review all these results and then they, or one of their team, will notify you of result information and recommendations.   Critical results will be addressed immediately, but otherwise, please allow us time to get back with you prior to you reaching out to us for questions.  This will usually take about 72 hours for labs and 5-7 days for procedure test results.

## 2021-02-01 DIAGNOSIS — K353 Acute appendicitis with localized peritonitis, without perforation or gangrene: Secondary | ICD-10-CM

## 2021-04-11 ENCOUNTER — Encounter: Admit: 2021-04-11 | Discharge: 2021-04-11 | Payer: MEDICARE

## 2021-04-11 DIAGNOSIS — L821 Other seborrheic keratosis: Secondary | ICD-10-CM

## 2021-04-11 DIAGNOSIS — K7581 Nonalcoholic steatohepatitis (NASH): Secondary | ICD-10-CM

## 2021-04-11 DIAGNOSIS — I519 Heart disease, unspecified: Secondary | ICD-10-CM

## 2021-04-11 DIAGNOSIS — C541 Malignant neoplasm of endometrium: Secondary | ICD-10-CM

## 2021-04-11 DIAGNOSIS — K219 Gastro-esophageal reflux disease without esophagitis: Secondary | ICD-10-CM

## 2021-04-11 DIAGNOSIS — I10 Essential (primary) hypertension: Secondary | ICD-10-CM

## 2021-04-11 DIAGNOSIS — E0590 Thyrotoxicosis, unspecified without thyrotoxic crisis or storm: Secondary | ICD-10-CM

## 2021-04-11 DIAGNOSIS — G4733 Obstructive sleep apnea (adult) (pediatric): Secondary | ICD-10-CM

## 2021-04-11 DIAGNOSIS — E119 Type 2 diabetes mellitus without complications: Secondary | ICD-10-CM

## 2021-04-11 DIAGNOSIS — N393 Stress incontinence (female) (male): Secondary | ICD-10-CM

## 2021-04-11 DIAGNOSIS — Z9889 Other specified postprocedural states: Secondary | ICD-10-CM

## 2021-04-11 DIAGNOSIS — R1110 Vomiting, unspecified: Secondary | ICD-10-CM

## 2021-04-11 DIAGNOSIS — H9190 Unspecified hearing loss, unspecified ear: Secondary | ICD-10-CM

## 2021-04-11 DIAGNOSIS — E669 Obesity, unspecified: Secondary | ICD-10-CM

## 2021-04-11 DIAGNOSIS — E785 Hyperlipidemia, unspecified: Secondary | ICD-10-CM

## 2021-04-11 DIAGNOSIS — M1990 Unspecified osteoarthritis, unspecified site: Secondary | ICD-10-CM

## 2021-04-11 DIAGNOSIS — R112 Nausea with vomiting, unspecified: Secondary | ICD-10-CM

## 2021-04-11 DIAGNOSIS — K7460 Unspecified cirrhosis of liver: Secondary | ICD-10-CM

## 2021-04-11 DIAGNOSIS — M549 Dorsalgia, unspecified: Secondary | ICD-10-CM

## 2021-04-11 DIAGNOSIS — E538 Deficiency of other specified B group vitamins: Secondary | ICD-10-CM

## 2021-04-11 NOTE — Progress Notes
Subjective     GYNECOLOGIC ONCOLOGY EVALUATION    Name:Valerie Price    Date: 04/11/21    Referring Physician & Primary Care Physician: Lavone Orn    Chief Complaint:   Chief Complaint   Patient presents with   ? Cancer     History of Present Illness:  Valerie Price is a 70 y.o. female with the following history.     Onc Timeline Overview Note   Valerie Price is a 70 y.o. female patient of Dr. Joycie Peek with Stage IA endometrial carcinoma, FIGO grade 1.  Patient presents to clinic today for her 3 years and 4 month cancer surveillance visit.     Patient underwent emergency appendectomy per Dr. Ranelle Oyster on 01/11/21, fully recovered. Dx with COVID19 in late 02/2021, has occasional/residual dry cough, decreased appetite (eating two meals per day), fatigue, otherwise recovered and diarrhea with COVID19 has now resolved. Exertional SOA per baseline, unchanged. Intermittent BLE edema per baseline, manages with elevation and compression PRN. Intermittent episodes of dry heaving that began prior to her appendectomy, associated abdominal muscle tenderness intermittently due to the retching. EGD was recommended by the NP at her hepatologist's office., but she has not yet completed. She has occasional episode of anal pain after larger volume BMs, has not been applying barrier cream. Has overflow incontinence per baseline.     She is hydrating well. Denies fevers, chills, chest pain, abdominal pain/distention, hematuria, urinary frequency/urgency/dysuria, and vaginal bleeding/discharge.      Health Maintenence:  Mammo: Fall 2021, Normal   Colonoscopy: Summer 2021 with polypectomy   Bone Density: 06/13/2016  Completed her covid19 vaccine series in February 2021     Endometrial adenocarcinoma (HCC)   11/17/2017 Initial Diagnosis    Patient referred to GYN on 11/02/2017 for postmenopausal bleeding, emb and pap smear. A sonogram done on 10/20/2017 demonstrated an abnormally thickened endometrium measuring 16 mm. Patient reports bleeding for the past couple months with some brownish discharge. EMB was collected and pathology showed endometrioid adenocarcinoma FIGO grade 1.     12/21/2017 Cancer Staged    Staging form: Corpus Uteri - Carcinoma And Carcinosarcoma, AJCC 8th Edition  - Clinical stage from 12/21/2017: FIGO Stage IA (cT1a, cN0(sn), cM0)     12/21/2017 Surgery    RATLH/BSO/bilateral SLN/retroperitoneal LND and cystourethroscopy per Dr. Lynnell Grain.     12/21/2017 Pathology    Endometrioid adenocarcinoma of the uterus, FIGO grade 1. DOI 4/20 mm. 0/5 LN involved  LVSI (-); MSI (-).    Estrogen Receptor (ER): ? ? ? ? ?95% ? ? Positive ? ?   Progesterone Receptor (PR): ? ? 90% ? ? Positive ? ?   Ki-67: ? ? ? ? ? ? ? ? ? ?20%   p53: ? ? ? ? ? ? ? ? ? ?1% ? ? Normal expression       GYN History   Pap: 11/02/2017-distant history of abnormal, denies any procedures  Menstrual Hx               LMP: 20 years ago               Having Periods:  No               Age at first period: 11-12    Pregnancy Hx               Number of pregnancies: None  Number of live births: n/a               Age of first live birth: n/a               Did you breastfeed: No                            If Yes, how long?                Oral Birth Control:  Yes               Years: 1 year               Infertility Medication:  No               Year/Med Name:   ?  Menopausal Hx               Age of last period: Late 60's               Hormone Replacement Therapy:  No               Years:     Past Medical History:  Medical History:   Diagnosis Date   ? Arthritis    ? B12 deficiency    ? Back pain    ? 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 Diabetes (HCC)     insulin dependent/oral   ? Endometrial adenocarcinoma (HCC) 10/2017   ? GERD (gastroesophageal reflux disease)    ? Hearing reduced    ? Heart disease     Stress test, 24 hour holter approximately 1 year ago   ? Hyperlipidemia    ? Hypertension    ? Hyperthyroidism 2001    s/p radioactive iodine    ? NASH (nonalcoholic steatohepatitis)    ? Obesity    ? OSA (obstructive sleep apnea)     does not use CPAP but has available at home   ? PONV (postoperative nausea and vomiting)    ? Post-operative state 12/21/2017   ? Stress incontinence      Past Surgical History:  Surgical History:   Procedure Laterality Date   ? LIVER BIOPSY  2012   ? HX CHOLECYSTECTOMY  2012   ? CATARACT REMOVAL Bilateral 2015   ? COLONOSCOPY  2016   ? HX HYSTERECTOMY  12/08/2017   ? ROBOT ASSISTED LAPAROSCOPIC TOTAL HYSTERECTOMY, BILATERAL SALPINGO-OOPHORECTOMY Bilateral 12/21/2017    Performed by Pernell Dupre, [facility] at [facility] OR   ? IDENTIFICATION SENTINEL LYMPH NODE Bilateral 12/21/2017    Performed by Pernell Dupre, MD at [facility] OR   ? CYSTOURETHROSCOPY N/A 12/21/2017    Performed by Pernell Dupre, MD at [facility] OR   ? LAPAROSCOPIC RETROPERITONEAL LYMPH NODE BIOPSY - MULTIPLE Bilateral 12/21/2017    Performed by Pernell Dupre, MD at [facility] OR   ? LAPAROSCOPIC APPENDECTOMY N/A 01/11/2021    Performed by Windy Fast, MD at [facility] OR   ? CORONARY STENT PLACEMENT       Medications:    Current Outpatient Medications:   ?  acetaminophen (TYLENOL) 500 mg tablet, Take 1,000 mg by mouth at bedtime daily. Max of 4,000 mg of acetaminophen in 24 hours., Disp: , Rfl:   ?  albuterol sulfate (PROAIR HFA) 90 mcg/actuation HFA aerosol inhaler, Inhale 2 puffs by mouth into the lungs twice daily. Shake well  before use., Disp: , Rfl:   ?  ascorbic acid (VITAMIN C) 500 mg tablet, Take 500 mg by mouth daily., Disp: , Rfl:   ?  carvedilol (COREG) 12.5 mg tablet, Take one tablet by mouth twice daily with meals., Disp: 180 tablet, Rfl: 3  ?  cholecalciferol (VITAMIN D-3) 1,000 units tablet, Take 1,000 Units by mouth daily., Disp: , Rfl:   ?  clopiDOGrel (PLAVIX) 75 mg tablet, Take 75 mg by mouth daily., Disp: , Rfl:   ?  cyanocobalamin (VITAMIN B-12, RUBRAMIN) 1,000 mcg/mL injection, Inject 1 mL into the muscle every 30 days., Disp: , Rfl:   ?  fish oil /omega-3 fatty acids (SEA-OMEGA) 340/1000 mg capsule, Take 1 capsule by mouth daily., Disp: , Rfl:   ?  furosemide (LASIX) 40 mg tablet, 1 Tab, daily, PO, 90 Tab, 3 Number of Refills, 3, Route to Pharmacy Electronically, [facility], [place], [place], TAB, 162.6, cm, 12/27/18 12:56:00 CDT, Height, 117, kg, 11/24/18 18:34:00 CST, Admission Weight, Disp: , Rfl:   ?  insulin aspart (U-100) (NOVOLOG FLEXPEN U-100 INSULIN) 100 unit/mL (3 mL) PEN, Inject 22 Units under the skin three times daily with meals., Disp: , Rfl:   ?  insulin detemir U-100 (LEVEMIR FLEXTOUCH) 100 unit/mL (3 mL) injection pen, Inject 80 Units under the skin at bedtime daily., Disp: , Rfl:   ?  isosorbide mononitrate SR (IMDUR) 30 mg tablet, Take 30 mg by mouth every morning., Disp: , Rfl:   ?  lactobacillus rhamnosus GG (CULTURELLE) 15 billion cell capsule, Take 1 capsule by mouth as directed daily., Disp: , Rfl:   ?  linagliptin 5 mg tab, Take 5 mg by mouth daily., Disp: , Rfl:   ?  metFORMIN (GLUCOPHAGE) 1,000 mg tablet, Take 1,000 mg by mouth twice daily with meals., Disp: , Rfl:   ?  metoprolol XL (TOPROL XL) 25 mg extended release tablet, Take 25 mg by mouth daily., Disp: , Rfl:   ?  oxyCODONE (ROXICODONE) 5 mg tablet, Take one tablet to two tablets by mouth every 4 hours as needed., Disp: 20 tablet, Rfl: 0  ?  pantoprazole DR (PROTONIX) 40 mg tablet, Take 40 mg by mouth at bedtime daily., Disp: , Rfl:   ?  PAPAYA ENZYME PO, Take 4 capsules by mouth daily as needed., Disp: , Rfl:   ?  polyethylene glycol 3350 (MIRALAX) 17 g packet, Take one packet by mouth daily as needed (Constipation - Second Line)., Disp: 12 each, Rfl: 0  ?  potassium chloride (MICRO-K) 10 mEq capsule, Take 10 mEq by mouth daily., Disp: , Rfl:   ?  quinapril (ACCUPRIL) 40 mg tablet, Take 40 mg by mouth daily., Disp: , Rfl:   ?  rosuvastatin (CRESTOR) 20 mg tablet, Take 20 mg by mouth daily., Disp: , Rfl:   ?  senna/docusate (SENOKOT-S) 8.6/50 mg tablet, Take one tablet by mouth daily as needed (Constipation - First Line)., Disp: 10 tablet, Rfl: 0  ?  vitamin E 400 unit capsule, Take 400 Units by mouth daily., Disp: , Rfl:   ?  vitamins, B complex tab, Take 1 tablet by mouth daily., Disp: , Rfl:   ?  vitamins, multiple tablet, Take 1 tablet by mouth daily., Disp: , Rfl:     Allergies:  Allergies   Allergen Reactions   ? Keflex [Cephalexin] ANAPHYLAXIS and HIVES   ? Penicillins ANAPHYLAXIS   ? Latex HIVES and RASH     tape after surgery, gloves worn by  providers make her break out    ? Aspirin SEE COMMENTS     Severely constipates her.     Social History:  Social History     Socioeconomic History   ? Marital status: Married   Tobacco Use   ? Smoking status: Former Smoker     Packs/day: 1.00     Years: 30.00     Pack years: 30.00     Types: Cigarettes     Quit date: 12/07/2008     Years since quitting: 12.3   ? Smokeless tobacco: Never Used   Vaping Use   ? Vaping Use: Never used   Substance and Sexual Activity   ? Alcohol use: Yes     Alcohol/week: 0.0 standard drinks     Comment: rarely   ? Drug use: No     Family History:  Family History   Problem Relation Age of Onset   ? Diabetes Mother    ? Heart Disease Mother    ? Hypertension Mother    ? Arthritis-osteo Mother    ? Diabetes Father    ? Heart Disease Father    ? Hypertension Father    ? Thyroid Disease Father    ? Diabetes Sister    ? Heart Disease Sister    ? High Cholesterol Sister    ? Arthritis-rheumatoid Sister    ? Migraines Sister    ? Depression Sister    ? Cancer Brother    ? Diabetes Brother    ? Hypertension Brother    ? Arthritis-osteo Brother    ? Arthritis-osteo Maternal Aunt    ? Cancer-Lung Paternal Aunt    ? Cancer Paternal Aunt    ? Diabetes Paternal Aunt    ? Diabetes Paternal Uncle    ? Hypertension Paternal Uncle    ? Cancer-Lung Maternal Grandmother    ? Diabetes Maternal Grandmother    ? Heart Disease Maternal Grandmother    ? Hypertension Maternal Grandmother    ? Arthritis-rheumatoid Maternal Grandmother    ? Diabetes Maternal Grandfather    ? High Cholesterol Paternal Grandmother    ? Diabetes Paternal Grandfather    ? Heart Disease Paternal Grandfather      REVIEW OF SYSTEMS:             CONSTITUTIONAL: Negative unless stated in HPI  EYES: Negative unless stated in HPI  ENT: Negative unless stated in HPI  RESPIRATORY: Negative unless stated in HPI  CARDIOVASCULAR: Negative unless stated in HPI  GI: Negative unless stated in HPI  GU: Negative unless stated in HPI  MUSCULO-SKELETAL: Negative unless stated in HPI  SKIN: Negative unless stated in HPI    ECOG 1     Physical Exam:  BP 139/61 (BP Source: Arm, Left Upper, Patient Position: Sitting)  - Pulse 90  - Temp 36.2 ?C (97.2 ?F) (Temporal)  - Resp 16  - Ht 162.6 cm (5' 4)  - Wt 112.9 kg (249 lb)  - SpO2 98%  - BMI 42.74 kg/m?   GENERAL APPEARANCE: Appears healthy. Alert; in no acute distress.  Pleasant.  HEENT: Unremarkable. No tenderness or masses noted.  NECK: Neck supple. No tenderness. No adenopathy.     LUNGS: Chest symmetrical. Good diaphragmatic excursion. Lungs clear; normal breath sounds.  CARDIOVASCULAR: RRR. Heart sounds normal.      ABDOMEN: Abdomen obese, soft, non-tender. No masses or hernia. No clinical evidence of ascites. Incisions well healed.   PELVIC: Left vulva with two benign  appearing sebaceous cysts. Excess subcutaneous tissue noted at mons pubis. Exam limited due to body habitus. Otherwise external genitalia, anus, perineum, urethral meatus, urethra, bladder and vagina normal. Cervix, uterus, and adnexae are surgically absent. Speculum exam with well approximated vaginal cuff. No erythema, bleeding, abnormal discharge, or lesions noted. Bimanual exam without cuff separation, masses or nodularity on the pelvic floor. Exam chaperoned by nurse    EXTREMITIES: Extremities normal. No joint deformities, edema, or skin discoloration. Station and gait normal.  SKIN: Multiple pasted on appearing lesions to abdomen and mons pubis, c/w seborrheic keratoses. Scattered bruising in various stages noted to abdomen 2/2 insulin injections. Skin color, texture, turgor normal. No rashes or lesions.  LYMPH NODES: No palpable supraclavicular, cervical or inguinal lymph nodes.    CBC w/Diff    Lab Results   Component Value Date/Time    WBC 6.3 01/13/2021 06:52 AM    RBC 3.78 (L) 01/13/2021 06:52 AM    HGB 10.4 (L) 01/13/2021 06:52 AM    HCT 31.4 (L) 01/13/2021 06:52 AM    MCV 83.1 01/13/2021 06:52 AM    MCH 27.5 01/13/2021 06:52 AM    MCHC 33.1 01/13/2021 06:52 AM    RDW 16.8 (H) 01/13/2021 06:52 AM    PLTCT 103 (L) 01/13/2021 06:52 AM    MPV 8.9 01/13/2021 06:52 AM    Lab Results   Component Value Date/Time    NEUT 53.6 05/09/2020 10:48 AM    ANC 2.6 05/09/2020 10:48 AM    LYMA 25 11/17/2017 01:12 PM    ALC 1.7 05/09/2020 10:48 AM    MONA 7 11/17/2017 01:12 PM    AMC 0.4 05/09/2020 10:48 AM    EOSA 1 11/17/2017 01:12 PM    AEC 0.1 05/09/2020 10:48 AM    BASA 0 11/17/2017 01:12 PM    ABC 0.1 05/09/2020 10:48 AM          Comprehensive Metabolic Profile    Lab Results   Component Value Date/Time    NA 135 (L) 01/13/2021 06:52 AM    K 5.2 (H) 01/13/2021 06:52 AM    CL 99 01/13/2021 06:52 AM    CO2 29 01/13/2021 06:52 AM    GAP 7 01/13/2021 06:52 AM    BUN 13 01/13/2021 06:52 AM    CR 0.87 01/13/2021 06:52 AM    GLU 194 (H) 01/13/2021 06:52 AM    GLU 110 (H) 11/14/2020 11:16 AM    Lab Results   Component Value Date/Time    CA 8.6 01/13/2021 06:52 AM    PO4 3.7 01/13/2021 06:52 AM    ALBUMIN 3.7 01/13/2021 06:52 AM    TOTPROT 6.6 01/13/2021 06:52 AM    ALKPHOS 73 01/13/2021 06:52 AM    AST 22 01/13/2021 06:52 AM    ALT 14 01/13/2021 06:52 AM    TOTBILI 0.7 01/13/2021 06:52 AM    GFR 72.0 11/14/2020 11:16 AM    GFRAA >60 11/17/2017 01:12 PM          Other labs    No results found for: ESR No results found for: LDH     ASSESSMENT/PLAN:  Valerie Price is a 70 y.o. female with Stage IA endometrial carcinoma, FIGO grade 1.  Patient presents to clinic today for her 3 years and 4 month cancer surveillance visit.   ? No evidence of disease on exam today.   ? Multiple, chronic, underlying conditions. Continue with close follow up with PCP.   ? Dry Heaves: Amb ref  gastroenterology.   ? Anal Pain: Suspect micro fissures. No pain with rectal exam today and no flaring hemorrhoids. Encourage barrier ointment such as desitin.   ? Also recommend barrier ointment such as aquaphor to bilateral vulva due to prevent skin irritation/breakdown due to baseline urinary incontinence.   ? Recommend daily exercise, diet high in fruits and vegetables, annual visits with PCP including updated vaccination and routine screening (mammogram, DEXA and colonoscopy).    ? Discussed possible symptoms of cancer recurrence, such as cough, chest pain, early satiety, abdominal pain/bloating, N/V, vaginal bleeding/discharge or change in bowel/bladder habits, dizziness, HA.    ? Patient verbalizes understanding of these instructions, denies further questions at this time.   ? RV with Dr. Reine Just in 1 year. Sooner if symptomatic.      Johnell Comings, MSN, APRN, FNP-C  Gynecologic Oncology    Total time spent date of service: 30 minutes, which was spent in review of patient's history, coordination of patient care, education and counseling of patient. Discussed the patient's diagnosis, management, and reviewed goals.

## 2021-05-09 ENCOUNTER — Encounter: Admit: 2021-05-09 | Discharge: 2021-05-09 | Payer: MEDICARE

## 2021-05-09 NOTE — Telephone Encounter
Pt called in and asked if she still needs to abdominal US every 6 months still or is it every Year now that she is seen once a year. Also have you put in the orders for a upper GI. Please return her call.

## 2021-05-16 ENCOUNTER — Encounter: Admit: 2021-05-16 | Discharge: 2021-05-16 | Payer: MEDICARE

## 2021-05-16 DIAGNOSIS — K7581 Nonalcoholic steatohepatitis (NASH): Secondary | ICD-10-CM

## 2021-05-16 DIAGNOSIS — K7460 Unspecified cirrhosis of liver: Secondary | ICD-10-CM

## 2021-05-16 LAB — IRON + BINDING CAPACITY + %SAT+ FERRITIN
% SATURATION: 254
IRON BINDING: 310 ug/dL (ref 250–450)
IRON: 56

## 2021-05-16 LAB — COMPREHENSIVE METABOLIC PANEL
ALBUMIN: 3.8 g/dL (ref 3.4–4.8)
ALK PHOSPHATASE: 66 U/L (ref 40–150)
ALT: 13 U/L (ref 0–55)
ANION GAP: 13 (ref 0–14)
AST: 27 U/L (ref 15–34)
BLD UREA NITROGEN: 14 mg/dL (ref 9.8–20.1)
CHLORIDE: 103 mmol/L (ref 98–107)
CO2: 23 mmol/L (ref 23–31)
CREATININE: 0.9 mL/Min/1.73 (ref 0.57–1.11)
GFR ESTIMATED: 58 mL/Min/1.73 — ABNORMAL LOW (ref >59)
GLUCOSE,RANDOM: 94 (ref 70–105)
POTASSIUM: 4.2 mmol/L (ref 3.5–5.1)
SODIUM: 135 mmol/L — ABNORMAL LOW (ref 136–145)
TOTAL BILIRUBIN: 0.4 mg/dL (ref 0.2–1.2)
TOTAL PROTEIN: 8 g/dL (ref 6.2–8.1)

## 2021-05-16 LAB — PROTIME INR (PT)
INR: 1.1 (ref 0.89–1.11)
PROTIME: 12 s — ABNORMAL HIGH (ref 10–12)

## 2021-05-24 ENCOUNTER — Encounter: Admit: 2021-05-24 | Discharge: 2021-05-24 | Payer: MEDICARE

## 2021-05-24 DIAGNOSIS — K7581 Nonalcoholic steatohepatitis (NASH): Secondary | ICD-10-CM

## 2021-05-24 DIAGNOSIS — K7460 Unspecified cirrhosis of liver: Secondary | ICD-10-CM

## 2021-05-24 DIAGNOSIS — E119 Type 2 diabetes mellitus without complications: Secondary | ICD-10-CM

## 2021-05-24 LAB — HEMOGLOBIN A1C: HEMOGLOBIN A1C: 6.7 — ABNORMAL HIGH (ref <5.7)

## 2021-05-27 ENCOUNTER — Encounter: Admit: 2021-05-27 | Discharge: 2021-05-27 | Payer: MEDICARE

## 2021-05-27 DIAGNOSIS — K7460 Unspecified cirrhosis of liver: Secondary | ICD-10-CM

## 2021-05-27 DIAGNOSIS — K7581 Nonalcoholic steatohepatitis (NASH): Secondary | ICD-10-CM

## 2021-07-17 ENCOUNTER — Encounter: Admit: 2021-07-17 | Discharge: 2021-07-17 | Payer: MEDICARE

## 2021-07-17 NOTE — Telephone Encounter
Pt called in and said that she had all of her labs done and was calling in to let you know. Please return her call.

## 2021-07-22 ENCOUNTER — Encounter: Admit: 2021-07-22 | Discharge: 2021-07-22 | Payer: MEDICARE

## 2021-07-22 DIAGNOSIS — K7581 Nonalcoholic steatohepatitis (NASH): Secondary | ICD-10-CM

## 2021-07-22 DIAGNOSIS — K7460 Unspecified cirrhosis of liver: Secondary | ICD-10-CM

## 2021-07-22 LAB — CBC AND DIFF
ABSOLUTE EOS COUNT: 0 uL (ref 0.0–0.6)
ABSOLUTE LYMPH COUNT: 1.2 uL (ref 0.9–5.1)
ABSOLUTE MONO COUNT: 0.9 /uL (ref 0.1–0.9)
HEMATOCRIT: 36 % — ABNORMAL LOW (ref 37.0–47.0)
HEMOGLOBIN: 11 g/dL — ABNORMAL LOW (ref 12.0–16.0)
MCH: 28
MCHC: 30
MCV: 94 fL (ref 0.0–99.0)
RBC COUNT: 3.8 uL — ABNORMAL LOW (ref 4.20–5.40)
WBC COUNT: 4.5 — ABNORMAL LOW (ref 4.8–10.8)

## 2021-07-22 LAB — ALPHA FETO PROTEIN (AFP): AFP: 3.3

## 2021-08-07 ENCOUNTER — Encounter: Admit: 2021-08-07 | Discharge: 2021-08-07 | Payer: MEDICARE

## 2021-09-28 ENCOUNTER — Encounter: Admit: 2021-09-28 | Discharge: 2021-09-28 | Payer: MEDICARE

## 2021-11-19 ENCOUNTER — Encounter: Admit: 2021-11-19 | Discharge: 2021-11-19 | Payer: MEDICARE

## 2021-11-19 DIAGNOSIS — K7460 Unspecified cirrhosis of liver: Secondary | ICD-10-CM

## 2021-11-19 NOTE — Telephone Encounter
Received an incoming call from:    [place] Name: Jazyiah    Relationship to Patient: Self    Callback Number: [phone]    Purpose of Call: Patient called to see if her orders for her [facility] can be faxed to [facility] she

## 2021-11-20 ENCOUNTER — Encounter: Admit: 2021-11-20 | Discharge: 2021-11-20 | Payer: MEDICARE

## 2021-11-20 NOTE — Progress Notes
Faxed [place] and Lab to [facility] per pt request [phone].

## 2021-11-22 ENCOUNTER — Encounter: Admit: 2021-11-22 | Discharge: 2021-11-22 | Payer: MEDICARE

## 2021-11-22 NOTE — Telephone Encounter
Pt called, she states that she attempted to call [facility] to schedule an [place] and labs but told her that they had no orders. Would like them faxed over to [facility] in [place]. No fax number provided

## 2021-12-04 ENCOUNTER — Encounter: Admit: 2021-12-04 | Discharge: 2021-12-04 | Payer: MEDICARE

## 2021-12-04 DIAGNOSIS — K7460 Unspecified cirrhosis of liver: Secondary | ICD-10-CM

## 2021-12-04 LAB — PROTIME INR (PT)
INR: 1.1 — ABNORMAL LOW (ref 34.1–44.9)
PROTIME: 12 s — ABNORMAL HIGH (ref 10–12.4)

## 2021-12-04 LAB — COMPREHENSIVE METABOLIC PANEL
ANION GAP: 10 — ABNORMAL LOW (ref 25.6–32.2)
CO2: 24
SODIUM: 136 mmol/L — ABNORMAL LOW (ref 3.93–5.22)

## 2021-12-04 LAB — CBC AND DIFF: WBC COUNT: 4.2

## 2021-12-11 ENCOUNTER — Encounter: Admit: 2021-12-11 | Discharge: 2021-12-11 | Payer: MEDICARE

## 2021-12-11 DIAGNOSIS — K7460 Unspecified cirrhosis of liver: Secondary | ICD-10-CM

## 2021-12-11 LAB — 25-OH VITAMIN D (D2 + D3): VITAMIN D (25-OH) TOTAL: 25 ng/mL — ABNORMAL LOW (ref 30–100)

## 2021-12-11 LAB — ALPHA FETO PROTEIN (AFP): AFP: 3.1 ng/mL (ref <6.1)

## 2021-12-16 ENCOUNTER — Encounter: Admit: 2021-12-16 | Discharge: 2021-12-16 | Payer: MEDICARE

## 2021-12-18 ENCOUNTER — Encounter: Admit: 2021-12-18 | Discharge: 2021-12-18 | Payer: MEDICARE

## 2021-12-18 DIAGNOSIS — K7460 Unspecified cirrhosis of liver: Secondary | ICD-10-CM

## 2021-12-19 NOTE — Progress Notes
Date of Service: 12/20/2021     Subjective:            History of Present Illness     Valerie Price is a 71 y.o. 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     Please refer to additional hepatology documentation noted on 05/23/2016 for additional pertinent history.    [facility]  Since seen in clinic on 12/18/2020, patient underwent emergency appendectomy on 01/11/2021. Since then, she reports doing well and denies hospitalizations or ER visits     She continues to have some symptoms of dry heaving though reports these episodes are much shorter duration than they used to be. She has an appointment to establish with [facility] in 02/2022. She reports she will go 3-4 days without having a bowel movement. She has miralax at home though rarely takes it.  She reports having some fatigue and cravings for ice. Her most recent iron studies were normal, though recent CBC was anemic with Hgb 9.     She has some inhalers she used to use regularly, though reports she does not use them anymore. However, she is feeling more short-of-breath and plans to follow with her PCP to restart these.     Some mild brain fog, mostly slow to recall certain things. Denies abdominal pain, vomiting, blood in her stools, jaundice. She has some mild lower extremity edema which is her baseline. She does not get much exercise currently and reports her knees are causing her trouble. She has as pool she walks during the summer so notes she is more active this coming time of year. She does not eat big meals anymore, but does not eat a very healthy diet. Her weight is stable.     Patient denies any alcohol, tobacco, and other substance use.     Recent Labs/imaging:   Pertinent labs were reviewed on initiation of note and below:    Lab Results   Component Value Date    TOTPROT 7.2 12/03/2021    ALBUMIN 3.7 12/03/2021    TOTBILI 0.31 12/03/2021    ALKPHOS 69 12/03/2021    AST 31 12/03/2021    ALT 13 12/03/2021      Lab Results   Component Value Date    NA 136 12/03/2021    K 4.6 12/03/2021    BUN 12.0 12/03/2021    CR 0.86 12/03/2021      Lab Results   Component Value Date    WBC 4.20 12/03/2021    HGB 9.2 (L) 12/03/2021    PLTCT 123 (L) 12/03/2021    INR 1.1 12/03/2021        MELD-Na score: 7 at 12/03/2021 12:06 PM  MELD score: 7 at 12/03/2021 12:06 PM  Calculated from:  Serum Creatinine: 0.86 mg/dL (Using min of 1 mg/dL) at 10/04/1094 04:54 PM  Serum Sodium: 136 mmol/L at 12/03/2021 12:06 PM  Total Bilirubin: 0.31 mg/dL (Using min of 1 mg/dL) at 0/05/8118 14:78 PM  INR(ratio): 1.1 at 12/03/2021 12:06 PM  Age: 60 years           Most recent imaging on 12/03/2021 showed hepatomegaly with hepatic steatosis.     Patient denies other complaints or concerns at this time.     Review of Systems  A complete 10 point review of  systems was negative except those listed in the HPI.    Reviewed and updated active medication and allergy list.     ? acetaminophen (TYLENOL) 500 mg tablet Take two tablets by mouth at bedtime daily. Max of 4,000 mg of acetaminophen in 24 hours.   ? albuterol sulfate (PROAIR HFA) 90 mcg/actuation HFA aerosol inhaler Inhale two puffs by mouth into the lungs twice daily. Shake well before use.   ? clopiDOGrel (PLAVIX) 75 mg tablet Take one tablet by mouth daily.   ? cyanocobalamin (VITAMIN B-12, RUBRAMIN) 1,000 mcg/mL injection Inject 1 mL into the muscle every 30 days.   ? ERGOcalciferoL (vitamin D2) (DRISDOL) 1,250 mcg (50,000 unit) capsule Take one capsule by mouth every 7 days for 12 doses.   ? furosemide (LASIX) 40 mg tablet 1 Tab, daily, PO, 90 Tab, 3 Number of Refills, 3, Route to Pharmacy Electronically, [facility], [place], [place], TAB, 162.6, cm, 12/27/18 12:56:00 CDT, Height, 117, kg, 11/24/18 18:34:00 CST, Admission Weight   ? insulin aspart (U-100) (NOVOLOG FLEXPEN U-100 INSULIN) 100 unit/mL (3 mL) PEN Inject twenty two Units under the skin three times daily with meals.   ? insulin detemir U-100 (LEVEMIR FLEXTOUCH) 100 unit/mL (3 mL) injection pen Inject eighty Units under the skin at bedtime daily.   ? isosorbide mononitrate SR (IMDUR) 30 mg tablet Take one tablet by mouth every morning.   ? lactobacillus rhamnosus GG (CULTURELLE) 15 billion cell capsule Take one capsule by mouth as directed daily.   ? linagliptin 5 mg tab Take one tablet by mouth daily.   ? lisinopriL (ZESTRIL) 20 mg tablet Take one tablet by mouth daily.   ? metFORMIN (GLUCOPHAGE) 1,000 mg tablet Take one tablet by mouth twice daily with meals.   ? metoprolol XL (TOPROL XL) 25 mg extended release tablet Take one tablet by mouth daily.   ? pantoprazole DR (PROTONIX) 40 mg tablet Take one tablet by mouth at bedtime daily.   ? PAPAYA ENZYME PO Take 4 capsules by mouth daily as needed.   ? potassium chloride (MICRO-K) 10 mEq capsule Take one capsule by mouth daily.   ? rosuvastatin (CRESTOR) 20 mg tablet Take one tablet by mouth daily.           Objective:           Vitals:    12/20/21 1113   BP: (!) 157/54   BP Source: Arm, Right Upper   Pulse: 98   Temp: 36.8 ?C (98.3 ?F)   SpO2: 95%   TempSrc: Oral   PainSc: Zero   Weight: 116.7 kg (257 lb 3.2 oz)   Height: 162.6 cm (5' 4)              MELD-Na score: 7 at 12/03/2021 12:06 PM  MELD score: 7 at 12/03/2021 12:06 PM  Calculated from:  Serum Creatinine: 0.86 mg/dL (Using min of 1 mg/dL) at 06/04/453 09:81 PM  Serum Sodium: 136 mmol/L at 12/03/2021 12:06 PM  Total Bilirubin: 0.31 mg/dL (Using min of 1 mg/dL) at 10/07/1476 29:56 PM  INR(ratio): 1.1 at 12/03/2021 12:06 PM  Age: 53 years      Physical Exam    Physical Exam    Vitals reviewed.   Constitutional: Well-developed, well-nourished, patient is obese,  in no apparent distress.    HEENT: Normocephalic. no scleral icterus. Dentition intact. .  Cardiac:  Regular rate and rhythm.    Respiratory: Clear to auscultation bilaterally.  No wheezes or rales.  GI:  Abdomen soft, non-distended, non-tender. No shifting dullness. No overt hepatosplenomegaly.     Skin:  Skin is warm and dry. No rash noted.  No jaundice. no spider nevi noted.  no palmar erythema.  Peripheral Vascular: trace lower extremity edema.  Musculoskeletal:  ROM intact, + muscle wasting.   Psychiatric: Normal mood and affect. Behavior is normal.  Neuro:  no asterixis, no tremor.             Assessment/Plan/Recommendations    Cirrhosis 2/2 Metabolic Dysfunction Associated Fatty Liver Disease/NASH:    ? 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  ? We reviewed the role of uncontrolled metabolic risk factors (i.e. hypertension, hyperlipidemia, diabetes, and untreated obstructive sleep apnea) and the negative impact of these disease process with a concurrent diagnosis of NAFLD  ? Regarding the management of fatty liver disease, discussed an appropriate diet, exercise and weight loss plan. It is recommended to lose 5% body weight in order to reduce steatosis; and 10% is needed to reduce inflammation associated with NASH.  The patient should exercise regularly 3-4 times per week, with an average of 30-45 minutes per day.    ? We discussed the implementation of a healthier diet such as Mediterranean style diet that is moderately low in carbohydrates and consists of lean proteins (chicken, fish, [place]). We reviewed the recommendation to reduce the amounts of red meat consumption as it has shown higher rates of organ fat deposition.   ? Consider the utility of liberalizing coffee and black tea consumption (1-2 cups daily) as some data supports this may slow progression and reverse effects of NASH-related fibrosis.  ? Continue with recommended HCC screenings as noted below.   ? We will continue to monitor MELD labs every 6 months or with noted changes in health condition/decompensation.    Hepatocellular Carcinoma Screening:   ? Due to advanced fibrosis/cirrhosis, recommend screening for hepatoma every 6 months with either abdominal ultrasound or by alternating abdominal ultrasound with EITHER a triple/quad phase CT Liver with IV contrast OR a Quad phase MRI Liver with IV contrast is suggested.  ? AFP levels should be checked at time of image screening.  ? Patient's most recent imaging noted no hepatoma on 12/03/2021    Lab Results   Component Value Date/Time    AFP 3.1 12/03/2021 12:06 PM       Liver Transplant Candidacy:   ? Patient is not a transplant candidate due to low MELD score .    Ascites/Lower Extremity Edema:   ? Continue with current diuretics.  ? Bilateral lower extremity edema noted to be trace at today's visit.  ? Continue to follow a sodium restricted (<2g sodium diet).    Portosystemic Encephalopathy (PSE)/Hepatic Encephalopathy:   ? Presents without hepatic encephalopathy symptoms today.  ? Patient reports some mild brain fog symptoms. She goes 3-4 days between bowel movements. Discussed utilizing Miralax and encouraging daily bowel movements prior to initiating lactulose.  ? Counseled on signs and symptoms of hepatic encephalopathy and it was discussed that the patient should not drive or operate heavy machinery when symptoms arise.  ? Recommend strict avoidance of medications such as narcotics, sedatives and sleep aids. Low dose Benadryl PRN or melatonin can be used for insomnia, if needed.    Esophageal Varices/Portal Hypertension:   ? Patient on EGD locally in Fall 2022, will request records.     Hypertension:  ? Patient reports her blood pressure is usually elevated after movement.   ?  BP today: 157/74  ? Patient is managed on: metoprolol, lisinopril  ? Continue to follow with PCP for further management.     Fatigue:   ? Patient reports increased fatigue and cravings for ice.   ? Most recent CBC showed hemoglobin 9.2 in 12/03/21, which is decreased from 11 in 05/15/21. On 05/15/21 iron studies were normal.   ? Will order iron panel for further evaluation.      Lipid Management/Screening:    ? Goal LDL in those with NALFD/NASH are < 100 mg/dL or 70mg /dL in individuals with known diabetes.  ? Patient is currently being treated with rosuvastatin and last LDL was unknown.   ? Continue to follow with PCP for further management.     Dietary Counseling:    ? Discussed above noted recommendations and encouraged continued lifestyle modifications.     Impaired Glucose Tolerance OR Diabetes Management/Screening:   ?  Last Hemoglobin A1C: 6.7  on 05/15/2021.  Patient is maintained on NovoLog, Levemir, metformin   ? Continue to follow with PCP/endocrinology for further management.     Bone health:   ? All patients with liver disease are at an increased risk for osteopenia/osteoporosis.  We strongly recommend screening for Vitamin D deficiency with aggressive supplementation/replacement, as indicated.   ? Replacement per CFT protocol for values less than 30ng/mL.  Lab Results   Component Value Date/Time    VITD25 25 (L) 12/03/2021 12:06 PM      ? Recommend calcium +Vitamin D supplementation (2000 IU of Vitamin D and 1200mg  Calcium).   ? Follow up DEXA scans to evaluate for improvement of bone density therapy encouraged.  ? Patient reports having DEXA scan locally in 2022, will request records.   ? Will order high-dose vitamin D replacement.     Vaccinations:  ? Hepatitis A/B (Strongly recommend vaccinations for hepatitis A and B if the patient not immune.)  ? Recommend booster series if low/no immunity. May obtain with our office or with PCP office.   Recommend patient receive annual flu vaccination and stay up-to-date on COVID vaccinations. Recommend staying current on pneumonia vaccinations if patient-appropriate.      Health Maintenance & Cancer Screening:  ? Recommend staying up to date for cancer screening: mammograms/PAP for women, prostate cancer screening for men, colon cancer screening for all.  ? Most recent colonoscopy 03/2020, will request records    Routine Health Care in Patient with Chronic Liver Disease:  ? All patients with liver disease should avoid the use of Non-steroidal Anti-Inflammatory (NSAID) medications as they can cause significant injury to the kidneys in this population.  ? The preferred analgesic in liver disease is Tylenol, up to 2g daily.  ? Recommended avoidance of herbal supplements and over the counter herbal remedies due to potential hepatic toxicities.      Sarcopenia/Nutrition:   ? With chronic liver disease there is a significant risk of protein malnutrition.    ? Discussed need to change dietary habits to improve overall protein balance including importance of eating between 1.2-1.5g/kg/day lean protein such as plant-based proteins (soy, legumes, nuts), dairy-based proteins (cheese, milk, yogurt), white meat chicken or pork, fish, and eggs.   ? Continue to follow a sodium restricted (<2g sodium diet), heart-healthy, Mediterranean style diet. It is best to minimize the intake of carbohydrates and sugars, to avoid obesity.   ? Strongly encourage protein supplements 2-3 times daily (Boost, Ensure, [employer] Milk, etc.) between meals, or 5-6 times daily for meal replacement, to meet  protein and caloric intake.  ? Recommend a bedtime snack with protein and complex carbohydrate to minimize risk of muscle catabolism overnight.      Follow Up:   6 months in clinic or sooner if needed. The patient is to call our office with any questions or changes to your health condition.     A total of 45 minutes was spent in the following activities: Preparing to see the patient, Obtaining and/or reviewing separately obtained history, Performing a medically appropriate examination and/or evaluation, Counseling and educating the patient/family/caregiver, Ordering medications, tests, or procedures, Documenting clinical information in the electronic or other health record and Independently interpreting results (not separately reported) and communicating results to the patient/family/caregiver.    Patient/patient family reports that all questions have been answered at this time.  No additional pending concerns.    Thank you very much for the opportunity to participate in the care of this patient.  If you have any further questions, please don't hesitate to contact our office.     _____________________________  Jolee Ewing, APRN, FNP-C  Hepatology & Liver Transplantation  [facility]  [phone]  Fax: [phone]

## 2021-12-20 ENCOUNTER — Encounter: Admit: 2021-12-20 | Discharge: 2021-12-20 | Payer: MEDICARE

## 2021-12-20 ENCOUNTER — Ambulatory Visit: Admit: 2021-12-20 | Discharge: 2021-12-20 | Payer: MEDICARE

## 2021-12-20 DIAGNOSIS — K766 Portal hypertension: Secondary | ICD-10-CM

## 2021-12-20 DIAGNOSIS — M549 Dorsalgia, unspecified: Secondary | ICD-10-CM

## 2021-12-20 DIAGNOSIS — E785 Hyperlipidemia, unspecified: Secondary | ICD-10-CM

## 2021-12-20 DIAGNOSIS — E0590 Thyrotoxicosis, unspecified without thyrotoxic crisis or storm: Secondary | ICD-10-CM

## 2021-12-20 DIAGNOSIS — E119 Type 2 diabetes mellitus without complications: Secondary | ICD-10-CM

## 2021-12-20 DIAGNOSIS — C541 Malignant neoplasm of endometrium: Secondary | ICD-10-CM

## 2021-12-20 DIAGNOSIS — I10 Essential (primary) hypertension: Secondary | ICD-10-CM

## 2021-12-20 DIAGNOSIS — Z9889 Other specified postprocedural states: Secondary | ICD-10-CM

## 2021-12-20 DIAGNOSIS — K7581 Nonalcoholic steatohepatitis (NASH): Secondary | ICD-10-CM

## 2021-12-20 DIAGNOSIS — G4733 Obstructive sleep apnea (adult) (pediatric): Secondary | ICD-10-CM

## 2021-12-20 DIAGNOSIS — M1990 Unspecified osteoarthritis, unspecified site: Secondary | ICD-10-CM

## 2021-12-20 DIAGNOSIS — K7460 Unspecified cirrhosis of liver: Secondary | ICD-10-CM

## 2021-12-20 DIAGNOSIS — K219 Gastro-esophageal reflux disease without esophagitis: Secondary | ICD-10-CM

## 2021-12-20 DIAGNOSIS — H9190 Unspecified hearing loss, unspecified ear: Secondary | ICD-10-CM

## 2021-12-20 DIAGNOSIS — N393 Stress incontinence (female) (male): Secondary | ICD-10-CM

## 2021-12-20 DIAGNOSIS — I519 Heart disease, unspecified: Secondary | ICD-10-CM

## 2021-12-20 DIAGNOSIS — E538 Deficiency of other specified B group vitamins: Secondary | ICD-10-CM

## 2021-12-20 DIAGNOSIS — E669 Obesity, unspecified: Secondary | ICD-10-CM

## 2021-12-20 DIAGNOSIS — R112 Nausea with vomiting, unspecified: Secondary | ICD-10-CM

## 2021-12-20 LAB — IRON + BINDING CAPACITY + %SAT+ FERRITIN
% SATURATION: 7 % — ABNORMAL LOW (ref 28–42)
FERRITIN: 7 ng/mL — ABNORMAL LOW (ref 10–200)
IRON BINDING: 514 ug/dL — ABNORMAL HIGH (ref 270–380)
IRON: 38 ug/dL — ABNORMAL LOW (ref 50–160)

## 2021-12-20 MED ORDER — ERGOCALCIFEROL (VITAMIN D2) 1,250 MCG (50,000 UNIT) PO CAP
50000 [IU] | ORAL_CAPSULE | ORAL | 0 refills | 56.00000 days | Status: AC
Start: 2021-12-20 — End: ?

## 2021-12-20 NOTE — Progress Notes
Patient iron panel shows iron deficiency anemia. Will move forward with iron infusion.

## 2021-12-23 ENCOUNTER — Encounter: Admit: 2021-12-23 | Discharge: 2021-12-23 | Payer: MEDICARE

## 2021-12-23 DIAGNOSIS — D509 Iron deficiency anemia, unspecified: Secondary | ICD-10-CM

## 2021-12-23 NOTE — Telephone Encounter
Notified pt of recommendation for iron infusion after reviewing most recent lab results. Pt prefers to have iron infusion completed locally. Referral placed and faxed to pt PCP to arrange locally. Pt will notify [facility] of any issues scheduling iron infusion.     Eda Keys RN, BSN

## 2022-01-01 ENCOUNTER — Encounter: Admit: 2022-01-01 | Discharge: 2022-01-01 | Payer: MEDICARE

## 2022-01-01 NOTE — Telephone Encounter
DEXA SCAN:      Report Dictated Date/Time Dictated By Status   Radiology Report November 9th, 2022 1:34pm Newell Coral , MD completed   [place] REPORT  [phone]    Patient:  Valerie, Price * Location:  [facility] Room:  Hospital#:  [id number]    Examination:  XR DEXA axial skeleton Sex:  F DOB:  [birth year] Account:  [id number]    Primary: Festus Barren APRN Date  : 08/07/21  Attending: Festus Barren APRN    Consulting:  Ordering: Festus Barren APRN    cc: Festus Barren, ARNP~  Fax to: ~    EXAM    DEXA    INDICATION    postmenopausal state  dexa. Pt has IDDM, pos Cirrhosis of Liver, Menopause age 36. T-Score -0.3 CS    TECHNIQUE    DEXA    COMPARISONS    None available    FINDINGS    Bone mineral density from L1-L4 is 1.447. This corresponds to a T-score of 2.0.    Left femoral neck bone mineral density is 0.947, T-score of -0.7.    Left femoral total bone mineral density is 1.027, T-score of 0.2.    Right femoral neck bone mineral density is 1.042, T-score of 0.0.    Right femoral total bone mineral density is 1.108, T-score of 0.8.    IMPRESSION    The lowest T-score is -0.7. This is consistent with normal values.    Electronically signed by: Sanjuana Letters. Jimmye Norman DO (Aug 07, 2021 13:34:05)  Workstation IP: [id number]    Dictated By: Newell Coral MD    DD/DT: 08/07/21 1334  TD/TT: Transcriptionist: RVR    Tech Notes:    dexa. Pt has IDDM, pos Cirrhosis of Liver, Menopause age 2. T-Score -0.3 CS

## 2022-03-12 ENCOUNTER — Encounter: Admit: 2022-03-12 | Discharge: 2022-03-12 | Payer: MEDICARE

## 2022-03-13 ENCOUNTER — Encounter: Admit: 2022-03-13 | Discharge: 2022-03-13 | Payer: MEDICARE

## 2022-03-13 NOTE — Telephone Encounter
From CareEverywhere:    Report Dictated Date/Time Dictated By Status   Radiology Report November 9th, 2022 1:34pm Newell Coral , MD completed   [place] REPORT  [phone]    Patient:  Valerie Price, Valerie Price * Location:  [facility] Room:  Hospital#:  [id number]    Examination:  XR DEXA axial skeleton Sex:  F DOB:  1950/10/21 Account:  [id number]    Primary: Festus Barren APRN Date  : 08/07/21  Attending: Festus Barren APRN    Consulting:  Ordering: Festus Barren APRN    cc: Festus Barren, ARNP~  Fax to: ~    EXAM    DEXA    INDICATION    postmenopausal state  dexa. Pt has IDDM, pos Cirrhosis of Liver, Menopause age 15. T-Score -0.3 CS    TECHNIQUE    DEXA    COMPARISONS    None available    FINDINGS    Bone mineral density from L1-L4 is 1.447. This corresponds to a T-score of 2.0.    Left femoral neck bone mineral density is 0.947, T-score of -0.7.    Left femoral total bone mineral density is 1.027, T-score of 0.2.    Right femoral neck bone mineral density is 1.042, T-score of 0.0.    Right femoral total bone mineral density is 1.108, T-score of 0.8.    IMPRESSION    The lowest T-score is -0.7. This is consistent with normal values.    Electronically signed by: Sanjuana Letters. Jimmye Norman DO (Aug 07, 2021 13:34:05)  Workstation IP: [id number]    Dictated By: Newell Coral MD    DD/DT: 08/07/21 1334  TD/TT: Transcriptionist: RVR

## 2022-03-19 ENCOUNTER — Encounter: Admit: 2022-03-19 | Discharge: 2022-03-19 | Payer: MEDICARE

## 2022-03-19 ENCOUNTER — Ambulatory Visit: Admit: 2022-03-19 | Discharge: 2022-03-19 | Payer: MEDICARE

## 2022-03-19 DIAGNOSIS — M1990 Unspecified osteoarthritis, unspecified site: Secondary | ICD-10-CM

## 2022-03-19 DIAGNOSIS — K7581 Nonalcoholic steatohepatitis (NASH): Secondary | ICD-10-CM

## 2022-03-19 DIAGNOSIS — C541 Malignant neoplasm of endometrium: Secondary | ICD-10-CM

## 2022-03-19 DIAGNOSIS — E538 Deficiency of other specified B group vitamins: Secondary | ICD-10-CM

## 2022-03-19 DIAGNOSIS — I10 Essential (primary) hypertension: Secondary | ICD-10-CM

## 2022-03-19 DIAGNOSIS — R112 Nausea with vomiting, unspecified: Secondary | ICD-10-CM

## 2022-03-19 DIAGNOSIS — E0590 Thyrotoxicosis, unspecified without thyrotoxic crisis or storm: Secondary | ICD-10-CM

## 2022-03-19 DIAGNOSIS — Z9889 Other specified postprocedural states: Secondary | ICD-10-CM

## 2022-03-19 DIAGNOSIS — K7460 Unspecified cirrhosis of liver: Secondary | ICD-10-CM

## 2022-03-19 DIAGNOSIS — N393 Stress incontinence (female) (male): Secondary | ICD-10-CM

## 2022-03-19 DIAGNOSIS — E669 Obesity, unspecified: Secondary | ICD-10-CM

## 2022-03-19 DIAGNOSIS — M549 Dorsalgia, unspecified: Secondary | ICD-10-CM

## 2022-03-19 DIAGNOSIS — I519 Heart disease, unspecified: Secondary | ICD-10-CM

## 2022-03-19 DIAGNOSIS — E119 Type 2 diabetes mellitus without complications: Secondary | ICD-10-CM

## 2022-03-19 DIAGNOSIS — R110 Nausea: Secondary | ICD-10-CM

## 2022-03-19 DIAGNOSIS — E785 Hyperlipidemia, unspecified: Secondary | ICD-10-CM

## 2022-03-19 DIAGNOSIS — H9190 Unspecified hearing loss, unspecified ear: Secondary | ICD-10-CM

## 2022-03-19 DIAGNOSIS — K219 Gastro-esophageal reflux disease without esophagitis: Secondary | ICD-10-CM

## 2022-03-19 DIAGNOSIS — G4733 Obstructive sleep apnea (adult) (pediatric): Secondary | ICD-10-CM

## 2022-03-19 MED ORDER — PROMETHAZINE 25 MG PO TAB
25 mg | ORAL_TABLET | ORAL | 3 refills | 7.00000 days | Status: AC | PRN
Start: 2022-03-19 — End: ?

## 2022-03-19 MED ORDER — PANTOPRAZOLE 40 MG PO TBEC
40 mg | ORAL_TABLET | Freq: Two times a day (BID) | ORAL | 2 refills | 90.00000 days | Status: AC
Start: 2022-03-19 — End: ?

## 2022-03-19 NOTE — Patient Instructions
Recommendations:  Pantoprazole 40 mg twice daily  Promethazine 25 mg three times daily as needed for nausea   Schedule gastric emptying scan - nausea - please call [phone], option #1 to schedule    Please let me know if you have any questions or concerns.    Thank you,  Merleen Nicely RN  Gastroenterology   [phone]

## 2022-03-27 ENCOUNTER — Encounter: Admit: 2022-03-27 | Discharge: 2022-03-27 | Payer: MEDICARE

## 2022-03-27 NOTE — Telephone Encounter
Patient called for me to send orders to  [facility] hospital Ph: [phone]. Inform patient when confirmation comes through.

## 2022-03-28 ENCOUNTER — Encounter: Admit: 2022-03-28 | Discharge: 2022-03-28 | Payer: MEDICARE

## 2022-04-03 ENCOUNTER — Encounter: Admit: 2022-04-03 | Discharge: 2022-04-03 | Payer: MEDICARE

## 2022-04-10 ENCOUNTER — Encounter: Admit: 2022-04-10 | Discharge: 2022-04-10 | Payer: MEDICARE

## 2022-04-10 DIAGNOSIS — H9190 Unspecified hearing loss, unspecified ear: Secondary | ICD-10-CM

## 2022-04-10 DIAGNOSIS — E669 Obesity, unspecified: Secondary | ICD-10-CM

## 2022-04-10 DIAGNOSIS — E785 Hyperlipidemia, unspecified: Secondary | ICD-10-CM

## 2022-04-10 DIAGNOSIS — R112 Nausea with vomiting, unspecified: Secondary | ICD-10-CM

## 2022-04-10 DIAGNOSIS — K7460 Unspecified cirrhosis of liver: Secondary | ICD-10-CM

## 2022-04-10 DIAGNOSIS — M1990 Unspecified osteoarthritis, unspecified site: Secondary | ICD-10-CM

## 2022-04-10 DIAGNOSIS — E119 Type 2 diabetes mellitus without complications: Secondary | ICD-10-CM

## 2022-04-10 DIAGNOSIS — L821 Other seborrheic keratosis: Secondary | ICD-10-CM

## 2022-04-10 DIAGNOSIS — I10 Essential (primary) hypertension: Secondary | ICD-10-CM

## 2022-04-10 DIAGNOSIS — M549 Dorsalgia, unspecified: Secondary | ICD-10-CM

## 2022-04-10 DIAGNOSIS — C541 Malignant neoplasm of endometrium: Secondary | ICD-10-CM

## 2022-04-10 DIAGNOSIS — E538 Deficiency of other specified B group vitamins: Secondary | ICD-10-CM

## 2022-04-10 DIAGNOSIS — Z9889 Other specified postprocedural states: Secondary | ICD-10-CM

## 2022-04-10 DIAGNOSIS — K219 Gastro-esophageal reflux disease without esophagitis: Secondary | ICD-10-CM

## 2022-04-10 DIAGNOSIS — G4733 Obstructive sleep apnea (adult) (pediatric): Secondary | ICD-10-CM

## 2022-04-10 DIAGNOSIS — E0590 Thyrotoxicosis, unspecified without thyrotoxic crisis or storm: Secondary | ICD-10-CM

## 2022-04-10 DIAGNOSIS — K7581 Nonalcoholic steatohepatitis (NASH): Secondary | ICD-10-CM

## 2022-04-10 DIAGNOSIS — I519 Heart disease, unspecified: Secondary | ICD-10-CM

## 2022-04-10 DIAGNOSIS — N393 Stress incontinence (female) (male): Secondary | ICD-10-CM

## 2022-04-10 NOTE — Progress Notes
Subjective     GYNECOLOGIC ONCOLOGY EVALUATION    Name:Valerie Price    Date: 04/10/22    Referring Physician & Primary Care Physician: Lavone Orn    Chief Complaint:   Chief Complaint   Patient presents with   ? Heme/Onc Care     History of Present Illness:  Valerie Price is a 71 y.o. female with the following history.     Onc Timeline Overview Note   Valerie Price is a 71 y.o. female patient of Dr. Joycie Peek with Stage IA endometrial carcinoma, FIGO grade 1.  Patient presents to clinic today for her 4 years and 4 month cancer surveillance visit.     Established with GI for episodes of dry heaving, relieved with sniffing alcohol. Protonix increased to 40 mg twice daily, promethazine added in and gastric emptying study ordered to rule out gastric paresis. LE edema is stable per baseline. She has some fatigue, but this is not interfering with her ADLs or her ability to drive her sister to/from appointments. She has some urinary urgency that is unchanged from baseline.     Otherwise, patient reports her appetite is good, she is hydrating well. She denies chest pain, new/worsening SOA, cough, abdominal pain/bloating, vomiting, constipation/diarrhea, hematuria, hematochezia, vaginal bleeding/discharge, urinary frequency/urgency/dysuria, and fevers/chills.    Of note, her younger sister was diagnosed with stage III uterine cancer, currently undergoing chemo/immunotherapy at [facility] per Dr. Terrilee Croak.     Health Maintenence:  Mammo: Fall 2022 in [place], Normal per patient   Colonoscopy: Summer 2021 with polypectomy   Bone Density: 06/13/2016  Completed her covid19 vaccine series in February 2021 + 3 boosters     Endometrial adenocarcinoma (HCC)   11/17/2017 Initial Diagnosis    Patient referred to GYN on 11/02/2017 for postmenopausal bleeding, emb and pap smear. A sonogram done on 10/20/2017 demonstrated an abnormally thickened endometrium measuring 16 mm. Patient reports bleeding for the past couple months with some brownish discharge. EMB was collected and pathology showed endometrioid adenocarcinoma FIGO grade 1.     12/21/2017 Cancer Staged    Staging form: Corpus Uteri - Carcinoma And Carcinosarcoma, AJCC 8th Edition  - Clinical stage from 12/21/2017: FIGO Stage IA (cT1a, cN0(sn), cM0)     12/21/2017 Surgery    RATLH/BSO/bilateral SLN/retroperitoneal LND and cystourethroscopy per Dr. Lynnell Grain.     12/21/2017 Pathology    Endometrioid adenocarcinoma of the uterus, FIGO grade 1. DOI 4/20 mm. 0/5 LN involved  LVSI (-); MSI (-).    Estrogen Receptor (ER): ? ? ? ? ?95% ? ? Positive ? ?   Progesterone Receptor (PR): ? ? 90% ? ? Positive ? ?   Ki-67: ? ? ? ? ? ? ? ? ? ?20%   p53: ? ? ? ? ? ? ? ? ? ?1% ? ? Normal expression       GYN History   Pap: 11/02/2017-distant history of abnormal, denies any procedures  Menstrual Hx               LMP: 20 years ago               Having Periods:  No               Age at first period: 11-12    Pregnancy Hx               Number of pregnancies: None  Number of live births: n/a               Age of first live birth: n/a               Did you breastfeed: No                            If Yes, how long?                Oral Birth Control:  Yes               Years: 1 year               Infertility Medication:  No               Year/Med Name:   ?  Menopausal Hx               Age of last period: Late 75's               Hormone Replacement Therapy:  No               Years:     Past Medical History:  Medical History:   Diagnosis Date   ? Arthritis    ? B12 deficiency    ? Back pain    ? 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 Diabetes (HCC)     insulin dependent/oral   ? Endometrial adenocarcinoma (HCC) 10/2017   ? GERD (gastroesophageal reflux disease)    ? Hearing reduced    ? Heart disease     Stress test, 24 hour holter approximately 1 year ago   ? Hyperlipidemia    ? Hypertension    ? Hyperthyroidism 2001    s/p radioactive iodine    ? NASH (nonalcoholic steatohepatitis)    ? Obesity    ? OSA (obstructive sleep apnea)     does not use CPAP but has available at home   ? PONV (postoperative nausea and vomiting)    ? Post-operative state 12/21/2017   ? Stress incontinence      Past Surgical History:  Surgical History:   Procedure Laterality Date   ? LIVER BIOPSY  2012   ? HX CHOLECYSTECTOMY  2012   ? CATARACT REMOVAL Bilateral 2015   ? COLONOSCOPY  2016   ? HX HYSTERECTOMY  12/08/2017   ? ROBOT ASSISTED LAPAROSCOPIC TOTAL HYSTERECTOMY, BILATERAL SALPINGO-OOPHORECTOMY Bilateral 12/21/2017    Performed by Pernell Dupre, MD at [facility] OR   ? IDENTIFICATION SENTINEL LYMPH NODE Bilateral 12/21/2017    Performed by Pernell Dupre, MD at [facility] OR   ? CYSTOURETHROSCOPY N/A 12/21/2017    Performed by Pernell Dupre[facility] at [facility] OR   ? LAPAROSCOPIC RETROPERITONEAL LYMPH NODE BIOPSY - MULTIPLE Bilateral 12/21/2017    Performed by Pernell Dupre, MD at [facility] OR   ? LAPAROSCOPIC APPENDECTOMY N/A 01/11/2021    Performed by Windy Fast, MD at [facility] OR   ? CORONARY STENT PLACEMENT       Medications:    Current Outpatient Medications:   ?  acetaminophen (TYLENOL) 500 mg tablet, Take two tablets by mouth at bedtime daily. Max of 4,000 mg of acetaminophen in 24 hours., Disp: , Rfl:   ?  albuterol sulfate (PROAIR HFA) 90 mcg/actuation HFA aerosol inhaler, Inhale two puffs by mouth into the lungs twice daily. Shake well  before use., Disp: , Rfl:   ?  ascorbic acid (VITAMIN C PO), Take  by mouth., Disp: , Rfl:   ?  clopiDOGrel (PLAVIX) 75 mg tablet, Take one tablet by mouth daily., Disp: , Rfl:   ?  cyanocobalamin (VITAMIN B-12, RUBRAMIN) 1,000 mcg/mL injection, Inject 1 mL into the muscle every 30 days., Disp: , Rfl:   ?  docosahexaenoic acid/epa (FISH OIL PO), Take  by mouth., Disp: , Rfl:   ?  ergocalciferol (vitamin D2) (VITAMIN D PO), Take  by mouth., Disp: , Rfl:   ?  furosemide (LASIX) 40 mg tablet, 1 Tab, daily, PO, 90 Tab, 3 Number of Refills, 3, Route to Pharmacy Electronically, [facility], [place], [place], TAB, 162.6, cm, 12/27/18 12:56:00 CDT, Height, 117, kg, 11/24/18 18:34:00 CST, Admission Weight, Disp: , Rfl:   ?  insulin aspart (U-100) (NOVOLOG FLEXPEN U-100 INSULIN) 100 unit/mL (3 mL) PEN, Inject twenty two Units under the skin three times daily with meals., Disp: , Rfl:   ?  insulin detemir U-100 (LEVEMIR FLEXTOUCH) 100 unit/mL (3 mL) injection pen, Inject eighty Units under the skin at bedtime daily., Disp: , Rfl:   ?  isosorbide mononitrate SR (IMDUR) 30 mg tablet, Take one tablet by mouth every morning., Disp: , Rfl:   ?  lactobacillus rhamnosus GG (CULTURELLE) 15 billion cell capsule, Take one capsule by mouth as directed daily., Disp: , Rfl:   ?  linagliptin 5 mg tab, Take one tablet by mouth daily., Disp: , Rfl:   ?  lisinopriL (ZESTRIL) 20 mg tablet, Take one tablet by mouth daily., Disp: , Rfl:   ?  metFORMIN (GLUCOPHAGE) 1,000 mg tablet, Take one tablet by mouth twice daily with meals., Disp: , Rfl:   ?  metoprolol XL (TOPROL XL) 25 mg extended release tablet, Take one tablet by mouth daily., Disp: , Rfl:   ?  Multivitamins with Fluoride (MULTI-VITAMIN PO), Take  by mouth., Disp: , Rfl:   ?  pantoprazole DR (PROTONIX) 40 mg tablet, Take one tablet by mouth twice daily., Disp: 180 tablet, Rfl: 2  ?  PAPAYA ENZYME PO, Take 4 capsules by mouth daily as needed., Disp: , Rfl:   ?  potassium chloride (MICRO-K) 10 mEq capsule, Take one capsule by mouth daily., Disp: , Rfl:   ?  promethazine (PHENERGAN) 25 mg tablet, Take one tablet by mouth every 8 hours as needed for Nausea or Vomiting., Disp: 90 tablet, Rfl: 3  ?  rosuvastatin (CRESTOR) 20 mg tablet, Take one tablet by mouth daily., Disp: , Rfl:   ?  VITAMIN E ACETATE PO, Take  by mouth., Disp: , Rfl:   ?  vitamins, B complex tablet, Take one tablet by mouth daily., Disp: , Rfl:     Allergies:  Allergies   Allergen Reactions   ? Keflex [Cephalexin] ANAPHYLAXIS and HIVES   ? Penicillins ANAPHYLAXIS   ? Latex HIVES and RASH     tape after surgery, gloves worn by providers make her break out    ? Aspirin SEE COMMENTS     Severely constipates her.     Social History:  Social History     Socioeconomic History   ? Marital status: Married   Tobacco Use   ? Smoking status: Former     Packs/day: 1.00     Years: 30.00     Pack years: 30.00     Types: Cigarettes     Quit date: 12/07/2008     Years  since quitting: 13.3   ? Smokeless tobacco: Never   Vaping Use   ? Vaping Use: Never used   Substance and Sexual Activity   ? Alcohol use: Yes     Alcohol/week: 0.0 standard drinks of alcohol     Comment: rarely   ? Drug use: No     Family History:  Family History   Problem Relation Age of Onset   ? Diabetes Mother    ? Heart Disease Mother    ? Hypertension Mother    ? Arthritis-osteo Mother    ? Diabetes Father    ? Heart Disease Father    ? Hypertension Father    ? Thyroid Disease Father    ? Diabetes Sister    ? Heart Disease Sister    ? High Cholesterol Sister    ? Arthritis-rheumatoid Sister    ? Migraines Sister    ? Depression Sister    ? Cancer Brother    ? Diabetes Brother    ? Hypertension Brother    ? Arthritis-osteo Brother    ? Arthritis-osteo Maternal Aunt    ? Cancer-Lung Paternal Aunt    ? Cancer Paternal Aunt    ? Diabetes Paternal Aunt    ? Diabetes Paternal Uncle    ? Hypertension Paternal Uncle    ? Cancer-Lung Maternal Grandmother    ? Diabetes Maternal Grandmother    ? Heart Disease Maternal Grandmother    ? Hypertension Maternal Grandmother    ? Arthritis-rheumatoid Maternal Grandmother    ? Diabetes Maternal Grandfather    ? High Cholesterol Paternal Grandmother    ? Diabetes Paternal Grandfather    ? Heart Disease Paternal Grandfather      REVIEW OF SYSTEMS:             CONSTITUTIONAL: Negative unless stated in HPI  EYES: Negative unless stated in HPI  ENT: Negative unless stated in HPI  RESPIRATORY: Negative unless stated in HPI  CARDIOVASCULAR: Negative unless stated in HPI  GI: Negative unless stated in HPI  GU: Negative unless stated in HPI  MUSCULO-SKELETAL: Negative unless stated in HPI  SKIN: Negative unless stated in HPI    ECOG 1     Physical Exam:  BP (!) 168/55 (BP Source: Arm, Left Upper, Patient Position: Sitting)  - Pulse 105  - Temp 36.6 ?C (97.8 ?F) (Temporal)  - Ht 162.6 cm (5' 4.02)  - Wt 115.1 kg (253 lb 12.8 oz)  - SpO2 93%  - BMI 43.54 kg/m?   GENERAL APPEARANCE: Appears healthy. Alert; in no acute distress.  Pleasant.  HEENT: Wearing glasses, otherwise unremarkable. No tenderness or masses noted.  NECK: Neck supple. No tenderness. No adenopathy.     LUNGS: Chest symmetrical. Good diaphragmatic excursion. Lungs clear; normal breath sounds.  CARDIOVASCULAR: RRR. Heart sounds normal.      ABDOMEN: Abdomen with central adiposity and striae, soft, non-tender. No masses or hernia. No clinical evidence of ascites. Incisions well healed.   PELVIC: Left inner labia minora with two benign appearing sebaceous cysts. Excess subcutaneous tissue noted at mons pubis. Exam limited due to body habitus. Otherwise external genitalia, anus, perineum, urethral meatus, urethra, bladder and vagina normal. Cervix, uterus, and adnexae are surgically absent. Speculum exam with well approximated vaginal cuff. No erythema, bleeding, abnormal discharge, or lesions noted. Bimanual exam without cuff separation, masses or nodularity on the pelvic floor. Exam chaperoned by nurse    EXTREMITIES:1+ BLE edema. Otherwise no joint deformities or skin discoloration.  Station and gait normal.  SKIN: Scattered pasted on appearing lesions to neck, chest, arms, abdomen and mons pubis, c/w seborrheic keratoses. Scattered bruising in various stages noted to abdomen 2/2 insulin injections. Skin color, texture, turgor normal. No rashes or lesions.  LYMPH NODES: No palpable supraclavicular, cervical or inguinal lymph nodes.    CBC w/Diff    Lab Results   Component Value Date/Time    WBC 4.20 12/03/2021 12:06 PM    RBC 3.82 (L) 12/03/2021 12:06 PM    HGB 9.2 (L) 12/03/2021 12:06 PM    HCT 30.8 (L) 12/03/2021 12:06 PM    MCV 80.6 12/03/2021 12:06 PM    MCH 24.1 (L) 12/03/2021 12:06 PM    MCHC 29.9 (L) 12/03/2021 12:06 PM    RDW 15.7 (H) 12/03/2021 12:06 PM    PLTCT 123 (L) 12/03/2021 12:06 PM    MPV 10.9 12/03/2021 12:06 PM    Lab Results   Component Value Date/Time    NEUT 64.8 12/03/2021 12:06 PM    ANC 2.72 12/03/2021 12:06 PM    LYMA 25 11/17/2017 01:12 PM    ALC 0.95 (L) 12/03/2021 12:06 PM    MONA 7 11/17/2017 01:12 PM    AMC 0.46 12/03/2021 12:06 PM    EOSA 1 11/17/2017 01:12 PM    AEC 0.03 (L) 12/03/2021 12:06 PM    BASA 0 11/17/2017 01:12 PM    ABC 0.03 12/03/2021 12:06 PM          Comprehensive Metabolic Profile    Lab Results   Component Value Date/Time    NA 136 12/03/2021 12:06 PM    K 4.6 12/03/2021 12:06 PM    CL 102 12/03/2021 12:06 PM    CO2 24.0 12/03/2021 12:06 PM    GAP 10 12/03/2021 12:06 PM    BUN 12.0 12/03/2021 12:06 PM    CR 0.86 12/03/2021 12:06 PM    GLU 131 (H) 12/03/2021 12:06 PM    Lab Results   Component Value Date/Time    CA 8.9 12/03/2021 12:06 PM    PO4 3.7 01/13/2021 06:52 AM    ALBUMIN 3.7 12/03/2021 12:06 PM    TOTPROT 7.2 12/03/2021 12:06 PM    ALKPHOS 69 12/03/2021 12:06 PM    AST 31 12/03/2021 12:06 PM    ALT 13 12/03/2021 12:06 PM    TOTBILI 0.31 12/03/2021 12:06 PM    GFR 58.6 (L) 05/15/2021 11:30 AM    GFRAA >60 11/17/2017 01:12 PM          Other labs    No results found for: ESR No results found for: LDH     ASSESSMENT/PLAN:  Valerie Price is a 71 y.o. female with Stage IA endometrial carcinoma, FIGO grade 1.  Patient presents to clinic today for her 4 years and 4 month cancer surveillance visit.   ? No evidence of disease on exam today.   ? Multiple, chronic, underlying conditions. Continue with close follow up with PCP, GI, hepatology, and cardiology.    ? Dry Heaves: IMproved with increasing protonix and adding in promethazine per GI. Also scheduling a gastric emptying study to rule out gastroparesis.   ? Recommend daily exercise, diet high in fruits and vegetables, annual visits with PCP including updated vaccination and routine screening (mammogram, DEXA and colonoscopy).    ? Discussed possible symptoms of cancer recurrence, such as cough, chest pain, early satiety, abdominal pain/bloating, N/V, vaginal bleeding/discharge or change in bowel/bladder habits, dizziness, HA.    ?  Patient verbalizes understanding of these instructions, denies further questions at this time.   ? RV with Dr. Reine Just in 1 year. Sooner if concerning symptoms arise.      Johnell Comings, MSN, APRN, FNP-C  Gynecologic Oncology      Total Time Today was 30 minutes in the following activities: Preparing to see the patient, Obtaining and/or reviewing separately obtained history, Performing a medically appropriate examination and/or evaluation, Counseling and educating the patient/family/caregiver, Documenting clinical information in the electronic or other health record and Care coordination (not separately reported)

## 2022-05-16 ENCOUNTER — Encounter: Admit: 2022-05-16 | Discharge: 2022-05-16 | Payer: MEDICARE

## 2022-05-16 NOTE — Telephone Encounter
Pt called in needing [place] and lab orders faxed over to [facility] in [place]. Please send fax.

## 2022-05-27 ENCOUNTER — Encounter: Admit: 2022-05-27 | Discharge: 2022-05-27 | Payer: MEDICARE

## 2022-05-27 NOTE — Telephone Encounter
Patient called would like orders for Ultra sound sent to [facility] in [place]  Please call let patient know when it is done. Per patient request. [phone]. Thank you

## 2022-05-27 NOTE — Telephone Encounter
Orders were faxed on 8/21, see additional TE. Left voicemail notifying pt.     Eda Keys RN, BSN

## 2022-05-29 ENCOUNTER — Encounter: Admit: 2022-05-29 | Discharge: 2022-05-29 | Payer: MEDICARE

## 2022-06-05 ENCOUNTER — Encounter: Admit: 2022-06-05 | Discharge: 2022-06-05 | Payer: MEDICARE

## 2022-06-05 DIAGNOSIS — R110 Nausea: Secondary | ICD-10-CM

## 2022-06-05 NOTE — Telephone Encounter
GE received, attached to order and routed to provider for review.

## 2022-06-10 ENCOUNTER — Encounter: Admit: 2022-06-10 | Discharge: 2022-06-10 | Payer: MEDICARE

## 2022-06-17 ENCOUNTER — Encounter: Admit: 2022-06-17 | Discharge: 2022-06-17 | Payer: MEDICARE

## 2022-06-25 ENCOUNTER — Encounter: Admit: 2022-06-25 | Discharge: 2022-06-25 | Payer: MEDICARE

## 2022-06-25 DIAGNOSIS — K7460 Unspecified cirrhosis of liver: Secondary | ICD-10-CM

## 2022-07-03 ENCOUNTER — Encounter: Admit: 2022-07-03 | Discharge: 2022-07-03 | Payer: MEDICARE

## 2022-07-04 ENCOUNTER — Ambulatory Visit: Admit: 2022-07-04 | Discharge: 2022-07-05 | Payer: MEDICARE

## 2022-07-04 ENCOUNTER — Encounter: Admit: 2022-07-04 | Discharge: 2022-07-04 | Payer: MEDICARE

## 2022-07-04 VITALS — BP 145/58 | HR 91 | Ht 64.0 in | Wt 249.0 lb

## 2022-07-04 DIAGNOSIS — E0590 Thyrotoxicosis, unspecified without thyrotoxic crisis or storm: Secondary | ICD-10-CM

## 2022-07-04 DIAGNOSIS — M1990 Unspecified osteoarthritis, unspecified site: Secondary | ICD-10-CM

## 2022-07-04 DIAGNOSIS — K7581 Nonalcoholic steatohepatitis (NASH): Secondary | ICD-10-CM

## 2022-07-04 DIAGNOSIS — N393 Stress incontinence (female) (male): Secondary | ICD-10-CM

## 2022-07-04 DIAGNOSIS — H9190 Unspecified hearing loss, unspecified ear: Secondary | ICD-10-CM

## 2022-07-04 DIAGNOSIS — I10 Essential (primary) hypertension: Secondary | ICD-10-CM

## 2022-07-04 DIAGNOSIS — K219 Gastro-esophageal reflux disease without esophagitis: Secondary | ICD-10-CM

## 2022-07-04 DIAGNOSIS — E669 Obesity, unspecified: Secondary | ICD-10-CM

## 2022-07-04 DIAGNOSIS — I519 Heart disease, unspecified: Secondary | ICD-10-CM

## 2022-07-04 DIAGNOSIS — E785 Hyperlipidemia, unspecified: Secondary | ICD-10-CM

## 2022-07-04 DIAGNOSIS — E538 Deficiency of other specified B group vitamins: Secondary | ICD-10-CM

## 2022-07-04 DIAGNOSIS — M549 Dorsalgia, unspecified: Secondary | ICD-10-CM

## 2022-07-04 DIAGNOSIS — C541 Malignant neoplasm of endometrium: Secondary | ICD-10-CM

## 2022-07-04 DIAGNOSIS — Z9889 Other specified postprocedural states: Secondary | ICD-10-CM

## 2022-07-04 DIAGNOSIS — G4733 Obstructive sleep apnea (adult) (pediatric): Secondary | ICD-10-CM

## 2022-07-04 DIAGNOSIS — E119 Type 2 diabetes mellitus without complications: Secondary | ICD-10-CM

## 2022-07-04 DIAGNOSIS — K7460 Unspecified cirrhosis of liver: Secondary | ICD-10-CM

## 2022-07-04 DIAGNOSIS — R112 Nausea with vomiting, unspecified: Secondary | ICD-10-CM

## 2022-07-04 NOTE — Patient Instructions
Reason for Visit  NASH Cirrhosis    Labs  Please continue to have labs every 6 months  You will be due for your next set of labs today and same day as follow up      Imaging/Cancer screening  With advanced liver disease you are at increased risk for liver cancer. We will screen you for this using Ultrasound,MRI, or CT and a blood test called an Alpha Feto Protein (AFP).  Please continue to have Ultrasound every 6 months  Your next imaging will be due in March 2024, same day as follow up      EGD  With liver disease it can cause an increase of pressure in your blood vessels. This pressure can go 2 ways.   Towards your spleen. This will cause splenomegaly (enlarged spleen) that will be seen on imaging. This does not typically require any intervention.   Towards your esophagus. This can cause esophageal varices (puffy veins in your throat) that can be at risk for bursting and cause bleeding.   We screen you for varices by completing an EGD.   If there are large varices, they can perform a procedure during the EGD called banding that will help deflate and get rid of these varices.   Your Next EGD is due September 2024.       Ascites/fluid management  You are not currently having issues with ascites. Please let us know if you develop this symptoms    Hepatic Encephalopathy/Confusion  You are not currently having issues with confusion. please let us know if you develop this symptoms      Diabetes  Keeping diabetes under control is very important in preventing progression in your liver diease. Please continue to work with your endocrinologist to manage this.  We recommend that you discuss starting on a GLP-1 class of medication with your endocrinologist.       Weight Management   Weight management is another very important factor in controlling your liver disease.   Keep up the good work on your weight loss!        If you have completed labs or imaging today and have not received results within 10 days, please contact our office.    If you have labs, imaging, or admission at another facility please let us know so we can request results. If you do not notify us, we will not know that you have had testing done.     [facility]  Dr. Ross Ludwig, APRN  Edwena Felty, RN  [phone]

## 2022-07-05 DIAGNOSIS — K7460 Unspecified cirrhosis of liver: Principal | ICD-10-CM

## 2022-07-08 ENCOUNTER — Encounter: Admit: 2022-07-08 | Discharge: 2022-07-08 | Payer: MEDICARE

## 2022-07-08 DIAGNOSIS — K7460 Unspecified cirrhosis of liver: Secondary | ICD-10-CM

## 2022-07-08 LAB — CBC AND DIFF
HEMATOCRIT: 36 % (ref 34.1–44.9)
HEMOGLOBIN: 11 g/dL (ref 11.2–15.7)
LYMPHOCYTES: 28 % (ref 19.3–53.1)
MCH: 27 pg (ref 25.6–32.2)
MCHC: 31 % — ABNORMAL LOW (ref 32.2–35.5)
MCV: 87 (ref 79.4–94.8)
MONOCYTES %: 10 % (ref 4.7–12.5)
MPV: 10
NEUTROPHILS %: 58 % (ref 34.0–71.1)
PLATELET COUNT: 93 x10-3/uL — ABNORMAL LOW (ref 182–369)
RBC COUNT: 4.1 (ref 3.93–5.22)
RDW: 17 — ABNORMAL HIGH (ref 11.6–14.4)
WBC COUNT: 4.4 (ref 3.98–10.04)

## 2022-07-08 LAB — ALPHA FETO PROTEIN (AFP), NON-PREGNANT: AFP: 3.2 ng/mL

## 2022-07-08 LAB — COMPREHENSIVE METABOLIC PANEL
ALBUMIN: 4 g/dL (ref 3.4–4.8)
ALK PHOSPHATASE: 78 U/L (ref 40–150)
CO2: 24 K/UL (ref 23–31)

## 2022-07-08 LAB — 25-OH VITAMIN D (D2 + D3): VITAMIN D (25-OH) TOTAL: 35

## 2022-09-19 ENCOUNTER — Encounter: Admit: 2022-09-19 | Discharge: 2022-09-19 | Payer: MEDICARE

## 2022-10-06 ENCOUNTER — Encounter: Admit: 2022-10-06 | Discharge: 2022-10-06 | Payer: MEDICARE

## 2022-10-06 ENCOUNTER — Ambulatory Visit: Admit: 2022-10-06 | Discharge: 2022-10-07 | Payer: MEDICARE

## 2022-10-06 DIAGNOSIS — M1990 Unspecified osteoarthritis, unspecified site: Secondary | ICD-10-CM

## 2022-10-06 DIAGNOSIS — M2550 Pain in unspecified joint: Secondary | ICD-10-CM

## 2022-10-06 DIAGNOSIS — R200 Anesthesia of skin: Secondary | ICD-10-CM

## 2022-10-06 DIAGNOSIS — R768 Other specified abnormal immunological findings in serum: Secondary | ICD-10-CM

## 2022-10-06 DIAGNOSIS — G4733 Obstructive sleep apnea (adult) (pediatric): Secondary | ICD-10-CM

## 2022-10-06 DIAGNOSIS — K7581 Nonalcoholic steatohepatitis (NASH): Secondary | ICD-10-CM

## 2022-10-06 DIAGNOSIS — Z9889 Other specified postprocedural states: Secondary | ICD-10-CM

## 2022-10-06 DIAGNOSIS — E669 Obesity, unspecified: Secondary | ICD-10-CM

## 2022-10-06 DIAGNOSIS — R112 Nausea with vomiting, unspecified: Secondary | ICD-10-CM

## 2022-10-06 DIAGNOSIS — I519 Heart disease, unspecified: Secondary | ICD-10-CM

## 2022-10-06 DIAGNOSIS — I10 Essential (primary) hypertension: Secondary | ICD-10-CM

## 2022-10-06 DIAGNOSIS — E119 Type 2 diabetes mellitus without complications: Secondary | ICD-10-CM

## 2022-10-06 DIAGNOSIS — C541 Malignant neoplasm of endometrium: Secondary | ICD-10-CM

## 2022-10-06 DIAGNOSIS — E0590 Thyrotoxicosis, unspecified without thyrotoxic crisis or storm: Secondary | ICD-10-CM

## 2022-10-06 DIAGNOSIS — M549 Dorsalgia, unspecified: Secondary | ICD-10-CM

## 2022-10-06 DIAGNOSIS — E538 Deficiency of other specified B group vitamins: Secondary | ICD-10-CM

## 2022-10-06 DIAGNOSIS — N393 Stress incontinence (female) (male): Secondary | ICD-10-CM

## 2022-10-06 DIAGNOSIS — K219 Gastro-esophageal reflux disease without esophagitis: Secondary | ICD-10-CM

## 2022-10-06 DIAGNOSIS — K7460 Unspecified cirrhosis of liver: Secondary | ICD-10-CM

## 2022-10-06 DIAGNOSIS — E785 Hyperlipidemia, unspecified: Secondary | ICD-10-CM

## 2022-10-06 DIAGNOSIS — H9190 Unspecified hearing loss, unspecified ear: Secondary | ICD-10-CM

## 2022-10-06 LAB — PROTEIN/CR RATIO,UR RAN
UR CREATININE, RAN: 38 mg/dL
UR TOTAL PROTEIN,RAN: 8 mg/dL

## 2022-10-06 LAB — SED RATE: ESR: 36 mm/h — ABNORMAL HIGH (ref 0–30)

## 2022-10-06 LAB — URIC ACID: URIC ACID: 6.3 mg/dL (ref 2.0–7.0)

## 2022-10-06 LAB — RHEUMATOID FACTOR (RF): RF SCREEN: <10 [IU]/mL (ref <25)

## 2022-10-06 LAB — CREATINE KINASE-CPK: CK TOTAL: 70 U/L (ref 21–215)

## 2022-10-06 LAB — URINALYSIS DIPSTICK: URINE SPEC GRAVITY: 1 (ref 1.005–1.030)

## 2022-10-06 LAB — HEPATITIS C ANTIBODY W REFLEX HCV PCR QUANT

## 2022-10-06 LAB — URINALYSIS, MICROSCOPIC

## 2022-10-06 LAB — C REACTIVE PROTEIN (CRP): C-REACTIVE PROTEIN: 0.5 mg/dL (ref <1.0)

## 2022-10-06 LAB — CCP IGG ANTIBODY

## 2022-10-06 LAB — TSH WITH FREE T4 REFLEX: TSH: 2.7 uU/mL (ref 0.35–5.00)

## 2022-10-07 DIAGNOSIS — M1990 Unspecified osteoarthritis, unspecified site: Secondary | ICD-10-CM

## 2022-10-07 DIAGNOSIS — R200 Anesthesia of skin: Secondary | ICD-10-CM

## 2022-10-07 DIAGNOSIS — R202 Paresthesia of skin: Secondary | ICD-10-CM

## 2022-10-07 DIAGNOSIS — M549 Dorsalgia, unspecified: Secondary | ICD-10-CM

## 2022-11-04 ENCOUNTER — Encounter: Admit: 2022-11-04 | Discharge: 2022-11-04 | Payer: MEDICARE

## 2022-11-05 ENCOUNTER — Encounter: Admit: 2022-11-05 | Discharge: 2022-11-05 | Payer: MEDICARE

## 2022-11-05 DIAGNOSIS — M549 Dorsalgia, unspecified: Secondary | ICD-10-CM

## 2022-11-05 DIAGNOSIS — M1990 Unspecified osteoarthritis, unspecified site: Secondary | ICD-10-CM

## 2022-11-05 DIAGNOSIS — M2550 Pain in unspecified joint: Secondary | ICD-10-CM

## 2022-11-05 DIAGNOSIS — R768 Other specified abnormal immunological findings in serum: Secondary | ICD-10-CM

## 2022-11-05 DIAGNOSIS — R200 Anesthesia of skin: Secondary | ICD-10-CM

## 2022-11-14 ENCOUNTER — Encounter: Admit: 2022-11-14 | Discharge: 2022-11-14 | Payer: MEDICARE

## 2022-11-26 ENCOUNTER — Encounter: Admit: 2022-11-26 | Discharge: 2022-11-26 | Payer: MEDICARE

## 2022-11-26 NOTE — Telephone Encounter
Spoke with patient. Advised her 4/12 f/u with Selena Batten has been cancelled as she will be out of the office that day. Advised her appt has been rescheduled on W. 4/10 at 130pm. Patient v/u. Call concluded.

## 2022-12-03 ENCOUNTER — Encounter: Admit: 2022-12-03 | Discharge: 2022-12-03 | Payer: MEDICARE

## 2022-12-18 ENCOUNTER — Encounter: Admit: 2022-12-18 | Discharge: 2022-12-18 | Payer: MEDICARE

## 2022-12-19 ENCOUNTER — Encounter: Admit: 2022-12-19 | Discharge: 2022-12-19 | Payer: MEDICARE

## 2022-12-19 DIAGNOSIS — K7460 Unspecified cirrhosis of liver: Secondary | ICD-10-CM

## 2022-12-19 LAB — CBC AND DIFF
ABSOLUTE EOS COUNT: 0 x10-3/uL — ABNORMAL HIGH (ref 0.04–0.54)
ABSOLUTE LYMPH COUNT: 1.1 x10-3/uL — ABNORMAL LOW (ref 1.18–3.74)
ABSOLUTE MONO COUNT: 0.3 /uL (ref 0.24–0.86)
ABSOLUTE NEUTROPHIL: 2.2 x10-3/uL
BASOPHILS: 0.3 %
EOSINOPHIL %: 1.6 % (ref 0.7–7)
HEMATOCRIT: 36 %
HEMOGLOBIN: 11 g/dL (ref 11.2–15.7)
LYMPHOCYTES: 30 % (ref 19.3–53)
MCH: 28 pg
MCHC: 32 — ABNORMAL LOW (ref 32.2–35.5)
MCV: 89
MONOCYTES %: 9 % (ref 4.7–12.5)
MPV: 10 fL
NEUTROPHILS %: 58 %
PLATELET COUNT: 105 x10-3/uL — ABNORMAL LOW (ref 182–369)
RBC COUNT: 4 uL (ref 3.93–5.22)
RDW: 13
WBC COUNT: 3.7 uL — ABNORMAL LOW (ref 3.98–10.04)

## 2022-12-19 LAB — COMPREHENSIVE METABOLIC PANEL
ALBUMIN: 4 g/dL
ALK PHOSPHATASE: 71 U/L
ALT: 21 U/L
ANION GAP: 11 meq/L (ref 8–18)
AST: 29 U/L (ref 5–34)
BLD UREA NITROGEN: 12 mg/dL (ref 9.8–20.1)
CALCIUM: 8.9 mg/dL (ref 8.4–10.2)
CHLORIDE: 104 MMOL/L — ABNORMAL LOW (ref 21–28)
CO2: 26 mmol/L — ABNORMAL LOW (ref 23–31)
CREATININE: 0.8 mg/dL
EGFR: 74 mL/Min/1.73 (ref >59)
GLUCOSE,RANDOM: 104
POTASSIUM: 4.3 mmol/L (ref 3.5–5.1)
SODIUM: 141 mmol/L (ref 136–145)
TOTAL BILIRUBIN: 0.4 mg/dL (ref 0.2–1.2)
TOTAL PROTEIN: 7.1 g/dL

## 2022-12-19 LAB — PROTIME INR (PT)
INR: 1.1 (ref 0.89–1.11)
PROTIME: 13 s — ABNORMAL HIGH (ref 10–12)

## 2022-12-23 ENCOUNTER — Encounter: Admit: 2022-12-23 | Discharge: 2022-12-23 | Payer: MEDICARE

## 2022-12-23 DIAGNOSIS — K7460 Unspecified cirrhosis of liver: Secondary | ICD-10-CM

## 2022-12-23 LAB — ALPHA FETO PROTEIN (AFP), NON-PREGNANT: AFP: 3.1 ng/mL (ref <6.1)

## 2022-12-31 ENCOUNTER — Encounter: Admit: 2022-12-31 | Discharge: 2022-12-31 | Payer: MEDICARE

## 2022-12-31 NOTE — Progress Notes
Date of Service: 01/07/2023     Subjective:            History of Present Illness     Valerie Price is a 72 y.o. 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     Please refer to additional hepatology documentation noted on 05/23/2016 for additional pertinent history.    [facility]  Since seen in clinic on 07/04/22, patient reports overall doing well and denies hospitalizations or ER visits.     She does report some recent UTIs and is currently being treated with Macrobid.     She continues to struggle with significant bilateral knee pain due to being bone-on-bone. She recently got injections. She reports this causes some issues with her blood sugars at times. Her blood glucose this morning was 82 and she believes her most recent A1C was ~7.1%. She follows with Dr. Threasa Beards at [facility] for her diabetes.     She is unable to be too active due to her knee pain, though she is looking forward to the summer as she has a swimming pool at her home and makes it a priority to walk around the perimeter nightly in the pool for 1 hour.     She reports some brain fog when she has a UTI. Denies abdominal pain, though has some flank pain with this recent UTI. Denies nausea/vomiting, blood in her stools lower extremity edema, jaundice. She is maintained on lasix 40mg  daily.    Patient denies any alcohol, tobacco, and other substance use.     She is trying to eat more lean proteins.     Her blood pressure today is 162/61. She reports it is likely elevated due to her pain on walking to the clinic. She denies headache, chest pain, vision changes. She reports at home her systolic blood pressure is usually ~140.     She is not interested in a GLP-1 agonist. Recent Labs/imaging:   Pertinent labs were reviewed on initiation of note and below:    Lab Results   Component Value Date    TOTPROT 7.1 12/18/2022    ALBUMIN 4.0 12/18/2022    TOTBILI 0.41 12/18/2022    ALKPHOS 71 12/18/2022    AST 29 12/18/2022    ALT 21 12/18/2022      Lab Results   Component Value Date    NA 141 12/18/2022    K 4.3 12/18/2022    BUN 12.0 12/18/2022    CR 0.83 12/18/2022      Lab Results   Component Value Date    WBC 3.79 (L) 12/18/2022    HGB 11.8 12/18/2022    PLTCT 105 (L) 12/18/2022    INR 1.1 12/18/2022        MELD 3.0: 8 at 12/18/2022 10:02 AM  MELD-Na: 7 at 12/18/2022 10:02 AM  Calculated from:  Serum Creatinine: 0.83 mg/dL (Using min of 1 mg/dL) at 1/61/0960 45:40 AM  Serum Sodium: 141 mmol/L (Using max of 137 mmol/L) at 12/18/2022 10:02 AM  Total Bilirubin: 0.41 mg/dL (Using min of 1 mg/dL) at 9/81/1914 78:29 AM  Serum Albumin: 4.0 g/dL (Using max of 3.5 g/dL) at 5/62/1308 65:78 AM  INR(ratio): 1.1 at 12/18/2022 10:02 AM  Age at listing (hypothetical): 47 years  Sex: Female at 12/18/2022 10:02 AM  Most recent imaging on 12/18/22 showed hepatomegaly with hepatic steatosis.     Patient denies other complaints or concerns at this time.     Review of Systems  A complete 10 point review of systems was negative except those listed in the HPI.    Reviewed and updated active medication and allergy list.      acetaminophen (TYLENOL) 500 mg tablet Take two tablets by mouth at bedtime daily. Max of 4,000 mg of acetaminophen in 24 hours.    clopiDOGrel (PLAVIX) 75 mg tablet Take one tablet by mouth daily.    cyanocobalamin (VITAMIN B-12, RUBRAMIN) 1,000 mcg/mL injection Inject 1 mL into the muscle every 30 days.    furosemide (LASIX) 40 mg tablet 1 Tab, daily, PO, 90 Tab, 3 Number of Refills, 3, Route to Pharmacy Electronically, [facility], [place], [place], TAB, 162.6, cm, 12/27/18 12:56:00 CDT, Height, 117, kg, 11/24/18 18:34:00 CST, Admission Weight    insulin aspart (U-100) (NOVOLOG FLEXPEN U-100 INSULIN) 100 unit/mL (3 mL) PEN Inject twenty two Units under the skin three times daily with meals.    insulin detemir U-100 (LEVEMIR FLEXTOUCH) 100 unit/mL (3 mL) injection pen Inject eighty Units under the skin at bedtime daily.    isosorbide mononitrate SR (IMDUR) 30 mg tablet Take one tablet by mouth every morning.    lactobacillus rhamnosus GG (CULTURELLE) 15 billion cell capsule Take one capsule by mouth as directed daily.    linagliptin 5 mg tab Take one tablet by mouth daily.    lisinopriL (ZESTRIL) 40 mg tablet Take one tablet by mouth every morning.    metFORMIN (GLUCOPHAGE) 1,000 mg tablet Take one tablet by mouth twice daily with meals.    metoprolol XL (TOPROL XL) 25 mg extended release tablet Take one tablet by mouth daily.    pantoprazole DR (PROTONIX) 40 mg tablet Take one tablet by mouth twice daily.    PAPAYA ENZYME PO Take 4 capsules by mouth daily as needed.    potassium chloride (MICRO-K) 10 mEq capsule Take one capsule by mouth daily.    rosuvastatin (CRESTOR) 20 mg tablet Take one tablet by mouth daily.           Objective:           Vitals:    01/07/23 1326   BP: (!) 162/61   BP Source: Arm, Right Lower   Pulse: 88   Temp: 36.5 ?C (97.7 ?F)   Resp: 15   SpO2: 97%   TempSrc: Oral   PainSc: Five   Weight: 110.7 kg (244 lb)   Height: 162.6 cm (5' 4)                MELD 3.0: 8 at 12/18/2022 10:02 AM  MELD-Na: 7 at 12/18/2022 10:02 AM  Calculated from:  Serum Creatinine: 0.83 mg/dL (Using min of 1 mg/dL) at 9/56/2130 86:57 AM  Serum Sodium: 141 mmol/L (Using max of 137 mmol/L) at 12/18/2022 10:02 AM  Total Bilirubin: 0.41 mg/dL (Using min of 1 mg/dL) at 8/46/9629 52:84 AM  Serum Albumin: 4.0 g/dL (Using max of 3.5 g/dL) at 1/32/4401 02:72 AM  INR(ratio): 1.1 at 12/18/2022 10:02 AM  Age at listing (hypothetical): 10 years  Sex: Female at 12/18/2022 10:02 AM        Physical Exam    Physical Exam    Vitals reviewed.   Constitutional: Well-developed, well-nourished, patient is obese,  in no apparent distress.    HEENT: Normocephalic. no scleral icterus. Dentition intact.  Cardiac:  Regular rate and rhythm.    Respiratory: Clear to auscultation bilaterally.  No wheezes or rales.  GI:  Abdomen soft, non-distended, non-tender. No shifting dullness. No overt hepatosplenomegaly.     Skin:  Skin is warm and dry. No rash noted.  No jaundice. no spider nevi noted.  no palmar erythema.  Peripheral Vascular: trace lower extremity edema.  Musculoskeletal:  ROM intact, + muscle wasting.   Psychiatric: Normal mood and affect. Behavior is normal.  Neuro:  no asterixis, no tremor.             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  We reviewed the role of uncontrolled metabolic risk factors (i.e. hypertension, hyperlipidemia, diabetes, and untreated obstructive sleep apnea) and the negative impact of these disease process with a concurrent diagnosis of NAFLD  Regarding the management of fatty liver disease, discussed an appropriate diet, exercise and weight loss plan. It is recommended to lose 5% body weight in order to reduce steatosis; and 10% is needed to reduce inflammation associated with NASH.  The patient should exercise regularly 3-4 times per week, with an average of 30-45 minutes per day.    We discussed the implementation of a healthier diet such as Mediterranean style diet that is moderately low in carbohydrates and consists of lean proteins (chicken, fish, [place]). We reviewed the recommendation to reduce the amounts of red meat consumption as it has shown higher rates of organ fat deposition.   Consider the utility of liberalizing coffee and black tea consumption (1-2 cups daily) as some data supports this may slow progression and reverse effects of NASH-related fibrosis.  Continue with recommended HCC screenings as noted below.   We will continue to monitor MELD labs every 6 months or with noted changes in health condition/decompensation.  She is not interested in a GLP-1.    Hepatocellular Carcinoma Screening:   Due to advanced fibrosis/cirrhosis, recommend screening for hepatoma every 6 months with either abdominal ultrasound or by alternating abdominal ultrasound with EITHER a triple/quad phase CT Liver with IV contrast OR a Quad phase MRI Liver with IV contrast is suggested.  AFP levels should be checked at time of image screening.  Patient's most recent imaging noted no hepatoma on 12/18/22. Will plan for [place] in 6 months    Lab Results   Component Value Date/Time    AFP 3.1 12/18/2022 10:02 AM         Ascites/Lower Extremity Edema:   Continue with current diuretics.  Bilateral lower extremity edema noted to be trace at today's visit.  Continue to follow a sodium restricted (<2g sodium diet).    Portosystemic Encephalopathy (PSE)/Hepatic Encephalopathy:   Presents without hepatic encephalopathy symptoms today.  Counseled on signs and symptoms of hepatic encephalopathy and it was discussed that the patient should not drive or operate heavy machinery when symptoms arise.  Recommend strict avoidance of medications such as narcotics, sedatives and sleep aids. Low dose Benadryl PRN or melatonin can be used for insomnia, if needed.    Esophageal Varices/Portal Hypertension:   Patient on EGD locally 06/12/21 showed no esophageal varices.   Recommend repeating in 05/2023. Referral placed.     Hypertension:  Patient reports her blood pressure is usually elevated after movement.   BP today: 162/61. She denies any headaches, chest pain, vision changes. She reports home systolic blood pressure is ~140.   Patient  is managed on: metoprolol, lisinopril  Continue to follow with PCP for further management.       Lipid Management/Screening:    Goal LDL in those with NALFD/NASH are < 100 mg/dL or 70mg /dL in individuals with known diabetes.  Patient is currently being treated with rosuvastatin and last LDL was unknown.   Continue to follow with PCP for further management.     Dietary Counseling:    Discussed above noted recommendations and encouraged continued lifestyle modifications.     Impaired Glucose Tolerance OR Diabetes Management/Screening:    Last Hemoglobin A1C: 6.7  on 05/15/2021.  Patient is maintained on NovoLog, Levemir, metformin   Continue to follow with PCP/endocrinology for further management.     Bone health:   All patients with liver disease are at an increased risk for osteopenia/osteoporosis.  We strongly recommend screening for Vitamin D deficiency with aggressive supplementation/replacement, as indicated.   Replacement per CFT protocol for values less than 30ng/mL.  Lab Results   Component Value Date/Time    VITD25 31 12/18/2022 10:02 AM      Recommend calcium +Vitamin D supplementation (2000 IU of Vitamin D and 1200mg  Calcium).   Follow up DEXA scans to evaluate for improvement of bone density therapy encouraged.  Patient is due for a DEXA scan, order placed.     Vaccinations:  Hepatitis A/B (Strongly recommend vaccinations for hepatitis A and B if the patient not immune.)  Recommend booster series if low/no immunity. May obtain with our office or with PCP office.   Recommend patient receive annual flu vaccination and stay up-to-date on COVID vaccinations. Recommend staying current on pneumonia vaccinations if patient-appropriate.      Health Maintenance & Cancer Screening:  Recommend staying up to date for cancer screening: mammograms/PAP for women, prostate cancer screening for men, colon cancer screening for all.  Most recent colonoscopy 03/2020.    Routine Health Care in Patient with Chronic Liver Disease:  All patients with liver disease should avoid the use of Non-steroidal Anti-Inflammatory (NSAID) medications as they can cause significant injury to the kidneys in this population.  The preferred analgesic in liver disease is Tylenol, up to 2g daily.  Recommended avoidance of herbal supplements and over the counter herbal remedies due to potential hepatic toxicities.      Sarcopenia/Nutrition:   With chronic liver disease there is a significant risk of protein malnutrition.    Discussed need to change dietary habits to improve overall protein balance including importance of eating between 1.2-1.5g/kg/day lean protein such as plant-based proteins (soy, legumes, nuts), dairy-based proteins (cheese, milk, yogurt), white meat chicken or pork, fish, and eggs.   Continue to follow a sodium restricted (<2g sodium diet), heart-healthy, Mediterranean style diet. It is best to minimize the intake of carbohydrates and sugars, to avoid obesity.   Strongly encourage protein supplements 2-3 times daily (Boost, Ensure, [employer] Milk, etc.) between meals, or 5-6 times daily for meal replacement, to meet protein and caloric intake.  Recommend a bedtime snack with protein and complex carbohydrate to minimize risk of muscle catabolism overnight.      Follow Up:   6 months in clinic or sooner if needed. The patient is to call our office with any questions or changes to your health condition.     A total of 45 minutes was spent in the following activities: Preparing to see the patient, Obtaining and/or reviewing separately obtained history, Performing a medically appropriate examination and/or evaluation, Counseling and educating the patient/family/caregiver, Ordering  medications, tests, or procedures, Documenting clinical information in the electronic or other health record and Independently interpreting results (not separately reported) and communicating results to the patient/family/caregiver.    Patient/patient family reports that all questions have been answered at this time.  No additional pending concerns.    Thank you very much for the opportunity to participate in the care of this patient.  If you have any further questions, please don't hesitate to contact our office. _____________________________  Jolee Ewing, APRN, FNP-C  Hepatology & Liver Transplantation  [facility]  [phone]  Fax: [phone]

## 2023-01-07 ENCOUNTER — Encounter: Admit: 2023-01-07 | Discharge: 2023-01-07 | Payer: MEDICARE

## 2023-01-07 ENCOUNTER — Ambulatory Visit: Admit: 2023-01-07 | Discharge: 2023-01-08 | Payer: MEDICARE

## 2023-01-07 DIAGNOSIS — K219 Gastro-esophageal reflux disease without esophagitis: Secondary | ICD-10-CM

## 2023-01-07 DIAGNOSIS — E785 Hyperlipidemia, unspecified: Secondary | ICD-10-CM

## 2023-01-07 DIAGNOSIS — H9190 Unspecified hearing loss, unspecified ear: Secondary | ICD-10-CM

## 2023-01-07 DIAGNOSIS — E669 Obesity, unspecified: Secondary | ICD-10-CM

## 2023-01-07 DIAGNOSIS — I10 Essential (primary) hypertension: Secondary | ICD-10-CM

## 2023-01-07 DIAGNOSIS — R112 Nausea with vomiting, unspecified: Secondary | ICD-10-CM

## 2023-01-07 DIAGNOSIS — N393 Stress incontinence (female) (male): Secondary | ICD-10-CM

## 2023-01-07 DIAGNOSIS — I519 Heart disease, unspecified: Secondary | ICD-10-CM

## 2023-01-07 DIAGNOSIS — K7460 Unspecified cirrhosis of liver: Secondary | ICD-10-CM

## 2023-01-07 DIAGNOSIS — K7581 Nonalcoholic steatohepatitis (NASH): Secondary | ICD-10-CM

## 2023-01-07 DIAGNOSIS — M549 Dorsalgia, unspecified: Secondary | ICD-10-CM

## 2023-01-07 DIAGNOSIS — E538 Deficiency of other specified B group vitamins: Secondary | ICD-10-CM

## 2023-01-07 DIAGNOSIS — E119 Type 2 diabetes mellitus without complications: Secondary | ICD-10-CM

## 2023-01-07 DIAGNOSIS — M1990 Unspecified osteoarthritis, unspecified site: Secondary | ICD-10-CM

## 2023-01-07 DIAGNOSIS — E0590 Thyrotoxicosis, unspecified without thyrotoxic crisis or storm: Secondary | ICD-10-CM

## 2023-01-07 DIAGNOSIS — Z9889 Other specified postprocedural states: Secondary | ICD-10-CM

## 2023-01-07 DIAGNOSIS — G4733 Obstructive sleep apnea (adult) (pediatric): Secondary | ICD-10-CM

## 2023-01-07 DIAGNOSIS — C541 Malignant neoplasm of endometrium: Secondary | ICD-10-CM

## 2023-01-08 DIAGNOSIS — K766 Portal hypertension: Secondary | ICD-10-CM

## 2023-01-08 DIAGNOSIS — K7581 Nonalcoholic steatohepatitis (NASH): Secondary | ICD-10-CM

## 2023-01-12 ENCOUNTER — Encounter: Admit: 2023-01-12 | Discharge: 2023-01-12 | Payer: MEDICARE

## 2023-01-12 NOTE — Telephone Encounter
[facility] called stating  the code on bone density order need to be changed her insurance does not cover current.

## 2023-01-12 NOTE — Telephone Encounter
LVM for [facility] radiology regarding bone density order.

## 2023-01-16 ENCOUNTER — Encounter: Admit: 2023-01-16 | Discharge: 2023-01-16 | Payer: MEDICARE

## 2023-01-16 NOTE — Telephone Encounter
Valerie Price called regarding the diagnosis on the bone density order is not covered, They request a new order and new diagnosis code. Please fax to [phone].

## 2023-01-19 ENCOUNTER — Encounter: Admit: 2023-01-19 | Discharge: 2023-01-19 | Payer: MEDICARE

## 2023-01-19 MED ORDER — PANTOPRAZOLE 40 MG PO TBEC
40 mg | ORAL_TABLET | Freq: Two times a day (BID) | ORAL | 2 refills | 90.00000 days | Status: AC
Start: 2023-01-19 — End: ?

## 2023-03-17 ENCOUNTER — Encounter: Admit: 2023-03-17 | Discharge: 2023-03-17 | Payer: MEDICARE

## 2023-05-01 ENCOUNTER — Encounter: Admit: 2023-05-01 | Discharge: 2023-05-01 | Payer: MEDICARE

## 2023-05-07 ENCOUNTER — Encounter: Admit: 2023-05-07 | Discharge: 2023-05-07 | Payer: MEDICARE

## 2023-05-08 ENCOUNTER — Encounter: Admit: 2023-05-08 | Discharge: 2023-05-08 | Payer: MEDICARE

## 2023-05-08 DIAGNOSIS — N390 Urinary tract infection, site not specified: Secondary | ICD-10-CM

## 2023-05-08 DIAGNOSIS — E785 Hyperlipidemia, unspecified: Secondary | ICD-10-CM

## 2023-05-08 DIAGNOSIS — K7581 Nonalcoholic steatohepatitis (NASH): Secondary | ICD-10-CM

## 2023-05-08 DIAGNOSIS — E538 Deficiency of other specified B group vitamins: Secondary | ICD-10-CM

## 2023-05-08 DIAGNOSIS — I10 Essential (primary) hypertension: Secondary | ICD-10-CM

## 2023-05-08 DIAGNOSIS — Z9889 Other specified postprocedural states: Secondary | ICD-10-CM

## 2023-05-08 DIAGNOSIS — E669 Obesity, unspecified: Secondary | ICD-10-CM

## 2023-05-08 DIAGNOSIS — I519 Heart disease, unspecified: Secondary | ICD-10-CM

## 2023-05-08 DIAGNOSIS — H9190 Unspecified hearing loss, unspecified ear: Secondary | ICD-10-CM

## 2023-05-08 DIAGNOSIS — N39490 Overflow incontinence: Secondary | ICD-10-CM

## 2023-05-08 DIAGNOSIS — N393 Stress incontinence (female) (male): Secondary | ICD-10-CM

## 2023-05-08 DIAGNOSIS — E119 Type 2 diabetes mellitus without complications: Secondary | ICD-10-CM

## 2023-05-08 DIAGNOSIS — K219 Gastro-esophageal reflux disease without esophagitis: Secondary | ICD-10-CM

## 2023-05-08 DIAGNOSIS — M1990 Unspecified osteoarthritis, unspecified site: Secondary | ICD-10-CM

## 2023-05-08 DIAGNOSIS — R1110 Vomiting, unspecified: Secondary | ICD-10-CM

## 2023-05-08 DIAGNOSIS — C541 Malignant neoplasm of endometrium: Secondary | ICD-10-CM

## 2023-05-08 DIAGNOSIS — G4733 Obstructive sleep apnea (adult) (pediatric): Secondary | ICD-10-CM

## 2023-05-08 DIAGNOSIS — K7460 Unspecified cirrhosis of liver: Secondary | ICD-10-CM

## 2023-05-08 DIAGNOSIS — E0590 Thyrotoxicosis, unspecified without thyrotoxic crisis or storm: Secondary | ICD-10-CM

## 2023-05-08 DIAGNOSIS — R112 Nausea with vomiting, unspecified: Secondary | ICD-10-CM

## 2023-05-08 DIAGNOSIS — M549 Dorsalgia, unspecified: Secondary | ICD-10-CM

## 2023-05-08 NOTE — Progress Notes
Subjective     GYNECOLOGIC ONCOLOGY EVALUATION    Name:Valerie Price    Date: 05/08/23    Referring Physician & Primary Care Physician: Lavone Orn    Chief Complaint:   Chief Complaint   Patient presents with    Heme/Onc Care     History of Present Illness:  Valerie Price is a 72 y.o. female with the following history.     Onc Timeline Overview Note   Valerie Price is a 72 y.o. female patient of Dr. Joycie Peek with Stage IA endometrial carcinoma, FIGO grade 1.  Patient presents to clinic today for her 5 years and 5 month cancer surveillance visit.        The patient presents with a history of recurrent urinary tract infections (UTIs) over the past year. The UTIs have been managed with repeated courses of antibiotics, most recently Ciprofloxacin. The patient has not yet seen a urologist for this issue.    The patient also reports experiencing fatigue and insomnia, often going to bed late and taking afternoon naps. Despite these sleep disturbances, her appetite remains good.    In addition to the UTIs, the patient has been dealing with incontinence, which has worsened to the point of requiring constant use of pads. This issue has not been addressed with medication thus far.    The patient also reports a history of dry heaves, which have been ongoing for two years. Multiple specialists have been consulted and various tests, including a gastric emptying study, have been conducted without a definitive diagnosis. However, a recent change in the timing of her Pantoprazole (Protonix) administration, from with meals to on an empty stomach, has led to significant improvement in this symptom.    The patient also mentions a sensation of tightness or cramping on one side of the neck, which she attributes to a possible pinched nerve.    The patient has a history of diabetes, managed with insulin. She has recently switched to a different pharmacy to reduce the cost of insulin. The patient has noted bruising at the insulin injection sites which is consistent with her baseline.       Of note, her younger sister was also diagnosed with stage III uterine cancer last year, following with [facility]/Dr. Terrilee Croak.     Health Maintenence:  Mammo: Fall 2022 in [place], Normal per patient   Colonoscopy: Summer 2021 with polypectomy   Bone Density: 06/13/2016  Completed her covid19 vaccine series in February 2021 + 3 boosters     Endometrial adenocarcinoma (HCC)   11/17/2017 Initial Diagnosis    Patient referred to GYN on 11/02/2017 for postmenopausal bleeding, emb and pap smear. A sonogram done on 10/20/2017 demonstrated an abnormally thickened endometrium measuring 16 mm. Patient reports bleeding for the past couple months with some brownish discharge. EMB was collected and pathology showed endometrioid adenocarcinoma FIGO grade 1.     12/21/2017 Cancer Staged    Staging form: Corpus Uteri - Carcinoma And Carcinosarcoma, AJCC 8th Edition  - Clinical stage from 12/21/2017: FIGO Stage IA (cT1a, cN0(sn), cM0)     12/21/2017 Surgery    RATLH/BSO/bilateral SLN/retroperitoneal LND and cystourethroscopy per Dr. Lynnell Grain.     12/21/2017 Pathology    Endometrioid adenocarcinoma of the uterus, FIGO grade 1. DOI 4/20 mm. 0/5 LN involved  LVSI (-); MSI (-).    Estrogen Receptor (ER):          95%     Positive       Progesterone  Receptor (PR):     90%     Positive       Ki-67:                    20%   p53:                    1%     Normal expression       GYN History   Pap: 11/02/2017-distant history of abnormal, denies any procedures  Menstrual Hx               LMP: 20 years ago               Having Periods:  No               Age at first period: 11-12    Pregnancy Hx               Number of pregnancies: None               Number of live births: n/a               Age of first live birth: n/a               Did you breastfeed: No                            If Yes, how long?                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    Diabetes (HCC)     insulin dependent/oral    Endometrial adenocarcinoma (HCC) 10/2017    GERD (gastroesophageal reflux disease)     Hearing reduced     Heart disease     Stress test, 24 hour holter approximately 1 year ago    Hyperlipidemia     Hypertension     Hyperthyroidism 2001    s/p radioactive iodine     NASH (nonalcoholic steatohepatitis)     Obesity     OSA (obstructive sleep apnea)     does not use CPAP but has available at home    PONV (postoperative nausea and vomiting)     Post-operative state 12/21/2017    Stress incontinence      Past Surgical History:  Surgical History:   Procedure Laterality Date    LIVER BIOPSY  2012    HX CHOLECYSTECTOMY  2012    CATARACT REMOVAL Bilateral 2015    COLONOSCOPY  2016  HX HYSTERECTOMY  12/08/2017    ROBOT ASSISTED LAPAROSCOPIC TOTAL HYSTERECTOMY, BILATERAL SALPINGO-OOPHORECTOMY Bilateral 12/21/2017    Performed by Pernell Dupre, MD at [facility] OR    IDENTIFICATION SENTINEL LYMPH NODE Bilateral 12/21/2017    Performed by Pernell Dupre, MD at [facility] OR    CYSTOURETHROSCOPY N/A 12/21/2017    Performed by Pernell Dupre, MD at [facility] OR    LAPAROSCOPIC RETROPERITONEAL LYMPH NODE BIOPSY - MULTIPLE Bilateral 12/21/2017    Performed by Pernell Dupre, MD at [facility] OR    LAPAROSCOPIC APPENDECTOMY N/A 01/11/2021    Performed by Windy Fast, MD at [facility] OR    CORONARY STENT PLACEMENT       Medications:    Current Outpatient Medications:     acetaminophen (TYLENOL) 500 mg tablet, Take two tablets by mouth at bedtime daily. Max of 4,000 mg of acetaminophen in 24 hours., Disp: , Rfl:     clopiDOGrel (PLAVIX) 75 mg tablet, Take one tablet by mouth daily., Disp: , Rfl:     cyanocobalamin (VITAMIN B-12, RUBRAMIN) 1,000 mcg/mL injection, Inject 1 mL into the muscle every 30 days., Disp: , Rfl:     furosemide (LASIX) 40 mg tablet, 1 Tab, daily, PO, 90 Tab, 3 Number of Refills, 3, Route to Pharmacy Electronically, [facility], [place], [place], TAB, 162.6, cm, 12/27/18 12:56:00 CDT, Height, 117, kg, 11/24/18 18:34:00 CST, Admission Weight, Disp: , Rfl:     insulin aspart (U-100) (NOVOLOG FLEXPEN U-100 INSULIN) 100 unit/mL (3 mL) PEN, Inject twenty two Units under the skin three times daily with meals., Disp: , Rfl:     insulin detemir U-100 (LEVEMIR FLEXTOUCH) 100 unit/mL (3 mL) injection pen, Inject eighty Units under the skin at bedtime daily., Disp: , Rfl:     isosorbide mononitrate SR (IMDUR) 30 mg tablet, Take one tablet by mouth every morning., Disp: , Rfl:     lactobacillus rhamnosus GG (CULTURELLE) 15 billion cell capsule, Take one capsule by mouth as directed daily., Disp: , Rfl:     linagliptin 5 mg tab, Take one tablet by mouth daily., Disp: , Rfl:     lisinopriL (ZESTRIL) 40 mg tablet, Take one tablet by mouth every morning., Disp: , Rfl:     metFORMIN (GLUCOPHAGE) 1,000 mg tablet, Take one tablet by mouth twice daily with meals., Disp: , Rfl:     metoprolol XL (TOPROL XL) 25 mg extended release tablet, Take one tablet by mouth daily., Disp: , Rfl:     pantoprazole DR (PROTONIX) 40 mg tablet, TAKE 1 TABLET BY MOUTH TWO TIMES A DAY, Disp: 180 tablet, Rfl: 2    PAPAYA ENZYME PO, Take 4 capsules by mouth daily as needed., Disp: , Rfl:     potassium chloride (MICRO-K) 10 mEq capsule, Take one capsule by mouth daily., Disp: , Rfl:     rosuvastatin (CRESTOR) 20 mg tablet, Take one tablet by mouth daily., Disp: , Rfl:     Allergies:  Allergies   Allergen Reactions    Cephalexin ANAPHYLAXIS, HIVES and SHORTNESS OF BREATH    Penicillins ANAPHYLAXIS    Latex HIVES and RASH     tape after surgery, gloves worn by providers make her break out      Aspirin PALPITATIONS and SEE COMMENTS     Severely constipates her.       Social History:  Social History     Socioeconomic History    Marital status: Married   Tobacco Use    Smoking  status: Former     Current packs/day: 0.00     Average packs/day: 1 pack/day for 30.0 years (30.0 ttl pk-yrs)     Types: Cigarettes     Start date: 12/08/1978     Quit date: 12/07/2008     Years since quitting: 14.4    Smokeless tobacco: Never   Vaping Use    Vaping status: Never Used   Substance and Sexual Activity    Alcohol use: Yes     Alcohol/week: 0.0 standard drinks of alcohol     Comment: rarely    Drug use: No     Family History:  Family History   Problem Relation Name Age of Onset    Diabetes Mother      Heart Disease Mother      Hypertension Mother      Arthritis-osteo Mother      Diabetes Father      Heart Disease Father      Hypertension Father      Thyroid Disease Father      Diabetes Sister      Heart Disease Sister      High Cholesterol Sister      Arthritis-rheumatoid Sister      Migraines Sister      Depression Sister      Cancer Brother      Diabetes Brother      Hypertension Brother      Arthritis-osteo Brother      Arthritis-osteo Maternal Aunt      Cancer-Lung Paternal Aunt      Cancer Paternal Aunt      Diabetes Paternal Aunt      Diabetes Paternal Uncle      Hypertension Paternal Uncle      Cancer-Lung Maternal Grandmother      Diabetes Maternal Grandmother      Heart Disease Maternal Grandmother      Hypertension Maternal Grandmother      Arthritis-rheumatoid Maternal Grandmother      Diabetes Maternal Grandfather      High Cholesterol Paternal Grandmother      Diabetes Paternal Grandfather      Heart Disease Paternal Grandfather       REVIEW OF SYSTEMS:             CONSTITUTIONAL: Negative unless stated in HPI  EYES: Negative unless stated in HPI  ENT: Negative unless stated in HPI  RESPIRATORY: Negative unless stated in HPI  CARDIOVASCULAR: Negative unless stated in HPI  GI: Negative unless stated in HPI  GU: Negative unless stated in HPI  MUSCULO-SKELETAL: Negative unless stated in HPI  SKIN: Negative unless stated in HPI    ECOG 1     Physical Exam:  BP (!) 157/66 (BP Source: Arm, Left Upper, Patient Position: Sitting)  - Pulse 97  - Temp 36.5 ?C (97.7 ?F) (Temporal)  - Ht 162.6 cm (5' 4.02)  - Wt 111.6 kg (246 lb)  - SpO2 94%  - BMI 42.20 kg/m?   GENERAL APPEARANCE: Appears healthy. Alert; in no acute distress.  Pleasant.  HEENT: Unremarkable. No tenderness or masses noted.  NECK: Neck supple. No tenderness. No adenopathy.  Normal neck ROM.   LUNGS: Chest symmetrical. Good diaphragmatic excursion. Lungs clear; normal breath sounds.  CARDIOVASCULAR: RRR. Heart sounds normal.      ABDOMEN: Abdomen with central adiposity and striae, soft, non-tender. No masses or hernia. No clinical evidence of ascites. Incisions well healed.   PELVIC: Left inner labia minora with two benign  appearing sebaceous cysts. Excess subcutaneous tissue noted at mons pubis. Exam limited due to body habitus. Otherwise external genitalia, anus, perineum, urethral meatus, urethra, bladder and vagina normal. Cervix, uterus, and adnexae are surgically absent. Speculum exam with well approximated vaginal cuff. No erythema, bleeding, abnormal discharge, or lesions noted. Bimanual exam without cuff separation, masses or nodularity on the pelvic floor. Exam chaperoned by nurse    EXTREMITIES:1+ BLE edema. Otherwise no joint deformities or skin discoloration. Uses cane for stability with ambulation.   SKIN: Scattered pasted on appearing lesions to neck, chest, arms, abdomen and mons pubis, c/w seborrheic keratoses. Scattered bruising in various stages noted to abdomen 2/2 insulin injections. Skin breakdown beneath pannus, consistent with yeast. Skin color, texture, turgor normal. No rashes or lesions.  LYMPH NODES: No palpable supraclavicular, cervical or inguinal lymph nodes.    CBC w/Diff    Lab Results   Component Value Date/Time    WBC 3.79 (L) 12/18/2022 10:02 AM    RBC 4.04 12/18/2022 10:02 AM    HGB 11.8 12/18/2022 10:02 AM    HCT 36.2 12/18/2022 10:02 AM    MCV 89.6 12/18/2022 10:02 AM    MCH 28.7 12/18/2022 10:02 AM    MCHC 32.0 (L) 12/18/2022 10:02 AM    RDW 13.8 12/18/2022 10:02 AM    PLTCT 105 (L) 12/18/2022 10:02 AM    MPV 10.3 12/18/2022 10:02 AM    Lab Results   Component Value Date/Time    NEUT 58.5 12/18/2022 10:02 AM    ANC 2.22 12/18/2022 10:02 AM    LYMA 25 11/17/2017 01:12 PM    ALC 1.15 (L) 12/18/2022 10:02 AM    MONA 7 11/17/2017 01:12 PM    AMC 0.34 12/18/2022 10:02 AM    EOSA 1 11/17/2017 01:12 PM    AEC 0.06 12/18/2022 10:02 AM    BASA 0 11/17/2017 01:12 PM    ABC 0.03 12/18/2022 10:02 AM          Comprehensive Metabolic Profile    Lab Results   Component Value Date/Time    NA 141 12/18/2022 10:02 AM    K 4.3 12/18/2022 10:02 AM    CL 104 12/18/2022 10:02 AM    CO2 26.0 12/18/2022 10:02 AM    GAP 11 12/18/2022 10:02 AM    BUN 12.0 12/18/2022 10:02 AM    CR 0.83 12/18/2022 10:02 AM    GLU 104 12/18/2022 10:02 AM    Lab Results   Component Value Date/Time    CA 8.9 12/18/2022 10:02 AM    PO4 3.7 01/13/2021 06:52 AM    ALBUMIN 4.0 12/18/2022 10:02 AM    TOTPROT 7.1 12/18/2022 10:02 AM    ALKPHOS 71 12/18/2022 10:02 AM    AST 29 12/18/2022 10:02 AM    ALT 21 12/18/2022 10:02 AM    TOTBILI 0.41 12/18/2022 10:02 AM    GFR 58.6 (L) 05/15/2021 11:30 AM    GFRAA >60 11/17/2017 01:12 PM          Other labs    Lab Results   Component Value Date/Time    ESR 36 (H) 10/06/2022 10:51 AM    No results found for: LDH     ASSESSMENT/PLAN:  Valerie Price is a 72 y.o. female with Stage IA endometrial carcinoma, FIGO grade 1.  Patient presents to clinic today for her 5 years and 5 month cancer surveillance visit.     This visit is for the continuation of the patients long-term care of  their complex medical issue - uterine cancer.     No evidence of disease on exam today.       Recurrent Urinary Tract Infections  Multiple episodes treated with antibiotics, most recently Ciprofloxacin. No urology consultation to date.  -Consider referral to Urology if UTIs persist.    Gastrointestinal Disturbance  History of dry heaves for several years, improved with Pantoprazole taken on an empty stomach.  -Continue Pantoprazole as currently prescribed.    Urinary Incontinence  Worsening symptoms, currently managed with pads.  -Consider referral to Urology for further management.    Skin Breakdown in Groin  Likely due to moisture and yeast buildup, currently using over-the-counter spray.  -Discussed appropriate skin care. Shower daily, do not scrub or exfoliate affected area. Avoid scented soaps, lotions, perfumes, detergents. Pat dry with towel and use hair dryer on cool setting. Wear loose fitting clothing and cotton underwear.     Recommend daily exercise, diet high in fruits and vegetables, annual visits with PCP including updated vaccination and routine screening (mammogram, DEXA and colonoscopy).      Discussed possible symptoms of GYN cancers or cancer recurrence such as cough, chest pain, early satiety, abdominal pain/bloating, N/V, vaginal bleeding/discharge, new vulvar lesions/itching/irritation, change in bowel/bladder habits, dizziness, and headache.      Patient verbalizes understanding of these instructions, denies further questions at this time.     Recommend yearly pelvic and rectovaginal exams with benign GYN/PCP as she is now 5 years NED. Patient is amenable with this plan.     GYN ONC team to remain available for consult on PRN basis.     Diona Fanti, APRN-NP        ATTESTATION  Documentation for this visit were assisted by [employer] via audio recorded by Johnell Comings APRN on 05/08/2023.      Total Time Today was 35 minutes in the following activities: Preparing to see the patient, Obtaining and/or reviewing separately obtained history, Performing a medically appropriate examination and/or evaluation, Counseling and educating the patient/family/caregiver, Documenting clinical information in the electronic or other health record, and Care coordination (not separately reported)

## 2023-06-12 ENCOUNTER — Encounter: Admit: 2023-06-12 | Discharge: 2023-06-12 | Payer: MEDICARE

## 2023-06-18 ENCOUNTER — Encounter: Admit: 2023-06-18 | Discharge: 2023-06-18 | Payer: MEDICARE

## 2023-06-18 NOTE — Telephone Encounter
Pt called in to request lab orders and [place] orders are faxed over to [facility] in [place]

## 2023-06-18 NOTE — Telephone Encounter
Patient called returning a call to NC. Camryn please call patient back. Thank you

## 2023-06-18 NOTE — Telephone Encounter
Left message letting pt know orders have been faxed to [facility].

## 2023-06-18 NOTE — Telephone Encounter
Returned call to pt. LVM letting pt know orders have been sent to [facility].

## 2023-06-26 ENCOUNTER — Encounter: Admit: 2023-06-26 | Discharge: 2023-06-26 | Payer: MEDICARE

## 2023-06-26 DIAGNOSIS — K7460 Unspecified cirrhosis of liver: Secondary | ICD-10-CM

## 2023-06-26 DIAGNOSIS — K766 Portal hypertension: Secondary | ICD-10-CM

## 2023-06-26 DIAGNOSIS — K7581 Nonalcoholic steatohepatitis (NASH): Secondary | ICD-10-CM

## 2023-06-26 LAB — CBC AND DIFF
ABSOLUTE EOS COUNT: 0 10*3/uL (ref 0.04–0.54)
ABSOLUTE LYMPH COUNT: 1.2 10*3/uL (ref 1.18–3.74)
ABSOLUTE MONO COUNT: 0.5 10*3/uL (ref 0.24–0.86)
ABSOLUTE NEUTROPHIL: 3.3 10*3/uL (ref 1.56–6.13)
BASOPHILS: 0.2 % (ref 0.1–1.2)
EOSINOPHIL %: 1.6 % (ref 0.7–7.0)
HEMATOCRIT: 34 % — ABNORMAL LOW (ref >59)
HEMOGLOBIN: 11 g/dL — ABNORMAL LOW (ref 11.2–15.7)
LYMPHOCYTES: 23 % (ref 19.3–53.1)
MCH: 27 pg (ref 25.6–32.2)
MCHC: 31 g/dL — ABNORMAL LOW (ref 32.2–35.5)
MCV: 86 fL (ref 79.4–94.8)
MONOCYTES %: 10 % (ref 4.7–12.5)
MPV: 10 fL (ref 9.4–12.4)
NEUTROPHILS %: 64 % (ref 34.0–71.1)
PLATELET COUNT: 105 10*3/uL — ABNORMAL LOW (ref 182–369)
RBC COUNT: 4 10*6/uL (ref 3.93–5.22)
RDW: 14 U/L — ABNORMAL HIGH (ref 11.6–14.4)
WBC COUNT: 5.1 10*3/uL (ref 3.98–10.04)

## 2023-06-26 LAB — PROTIME INR (PT)
INR: 1.1 mL (ref 0.89–1.11)
PROTIME: 12 s — ABNORMAL HIGH (ref 10–12.4)

## 2023-06-26 LAB — COMPREHENSIVE METABOLIC PANEL
POTASSIUM: 4.6 mmol/L (ref 3.5–5.1)
SODIUM: 137 mmol/L (ref 136–145)

## 2023-06-30 ENCOUNTER — Encounter: Admit: 2023-06-30 | Discharge: 2023-06-30 | Payer: MEDICARE

## 2023-06-30 DIAGNOSIS — K7460 Unspecified cirrhosis of liver: Secondary | ICD-10-CM

## 2023-06-30 DIAGNOSIS — K766 Portal hypertension: Secondary | ICD-10-CM

## 2023-06-30 DIAGNOSIS — K7581 Nonalcoholic steatohepatitis (NASH): Secondary | ICD-10-CM

## 2023-06-30 LAB — 25-OH VITAMIN D (D2 + D3): VITAMIN D (25-OH) TOTAL: 30 ng/mL (ref 30–100)

## 2023-06-30 LAB — ALPHA FETO PROTEIN (AFP), NON-PREGNANT: AFP: 2.6 ng/mL (ref <6.1)

## 2023-07-08 ENCOUNTER — Encounter: Admit: 2023-07-08 | Discharge: 2023-07-08 | Payer: MEDICARE

## 2023-07-10 ENCOUNTER — Ambulatory Visit: Admit: 2023-07-10 | Discharge: 2023-07-11 | Payer: MEDICARE

## 2023-07-10 ENCOUNTER — Encounter: Admit: 2023-07-10 | Discharge: 2023-07-10 | Payer: MEDICARE

## 2023-07-10 DIAGNOSIS — K219 Gastro-esophageal reflux disease without esophagitis: Secondary | ICD-10-CM

## 2023-07-10 DIAGNOSIS — M1990 Unspecified osteoarthritis, unspecified site: Secondary | ICD-10-CM

## 2023-07-10 DIAGNOSIS — E0590 Thyrotoxicosis, unspecified without thyrotoxic crisis or storm: Secondary | ICD-10-CM

## 2023-07-10 DIAGNOSIS — E538 Deficiency of other specified B group vitamins: Secondary | ICD-10-CM

## 2023-07-10 DIAGNOSIS — K7581 Nonalcoholic steatohepatitis (NASH): Secondary | ICD-10-CM

## 2023-07-10 DIAGNOSIS — I10 Essential (primary) hypertension: Secondary | ICD-10-CM

## 2023-07-10 DIAGNOSIS — E119 Type 2 diabetes mellitus without complications: Secondary | ICD-10-CM

## 2023-07-10 DIAGNOSIS — Z9889 Other specified postprocedural states: Secondary | ICD-10-CM

## 2023-07-10 DIAGNOSIS — E785 Hyperlipidemia, unspecified: Secondary | ICD-10-CM

## 2023-07-10 DIAGNOSIS — K7460 Unspecified cirrhosis of liver: Secondary | ICD-10-CM

## 2023-07-10 DIAGNOSIS — R112 Nausea with vomiting, unspecified: Secondary | ICD-10-CM

## 2023-07-10 DIAGNOSIS — E669 Obesity, unspecified: Secondary | ICD-10-CM

## 2023-07-10 DIAGNOSIS — I519 Heart disease, unspecified: Secondary | ICD-10-CM

## 2023-07-10 DIAGNOSIS — G4733 Obstructive sleep apnea (adult) (pediatric): Secondary | ICD-10-CM

## 2023-07-10 DIAGNOSIS — H9190 Unspecified hearing loss, unspecified ear: Secondary | ICD-10-CM

## 2023-07-10 DIAGNOSIS — C541 Malignant neoplasm of endometrium: Secondary | ICD-10-CM

## 2023-07-10 DIAGNOSIS — N393 Stress incontinence (female) (male): Secondary | ICD-10-CM

## 2023-07-10 DIAGNOSIS — M549 Dorsalgia, unspecified: Secondary | ICD-10-CM

## 2023-07-13 ENCOUNTER — Encounter: Admit: 2023-07-13 | Discharge: 2023-07-13 | Payer: MEDICARE

## 2023-07-13 DIAGNOSIS — G4733 Obstructive sleep apnea (adult) (pediatric): Secondary | ICD-10-CM

## 2023-07-13 DIAGNOSIS — M1990 Unspecified osteoarthritis, unspecified site: Secondary | ICD-10-CM

## 2023-07-13 DIAGNOSIS — K7460 Unspecified cirrhosis of liver: Secondary | ICD-10-CM

## 2023-07-13 DIAGNOSIS — E669 Obesity, unspecified: Secondary | ICD-10-CM

## 2023-07-13 DIAGNOSIS — N393 Stress incontinence (female) (male): Secondary | ICD-10-CM

## 2023-07-13 DIAGNOSIS — Z9889 Other specified postprocedural states: Secondary | ICD-10-CM

## 2023-07-13 DIAGNOSIS — I10 Essential (primary) hypertension: Secondary | ICD-10-CM

## 2023-07-13 DIAGNOSIS — C541 Malignant neoplasm of endometrium: Secondary | ICD-10-CM

## 2023-07-13 DIAGNOSIS — E0590 Thyrotoxicosis, unspecified without thyrotoxic crisis or storm: Secondary | ICD-10-CM

## 2023-07-13 DIAGNOSIS — H9190 Unspecified hearing loss, unspecified ear: Secondary | ICD-10-CM

## 2023-07-13 DIAGNOSIS — E785 Hyperlipidemia, unspecified: Secondary | ICD-10-CM

## 2023-07-13 DIAGNOSIS — K7581 Nonalcoholic steatohepatitis (NASH): Secondary | ICD-10-CM

## 2023-07-13 DIAGNOSIS — I519 Heart disease, unspecified: Secondary | ICD-10-CM

## 2023-07-13 DIAGNOSIS — E538 Deficiency of other specified B group vitamins: Secondary | ICD-10-CM

## 2023-07-13 DIAGNOSIS — M549 Dorsalgia, unspecified: Secondary | ICD-10-CM

## 2023-07-13 DIAGNOSIS — E119 Type 2 diabetes mellitus without complications: Secondary | ICD-10-CM

## 2023-07-13 DIAGNOSIS — R112 Nausea with vomiting, unspecified: Secondary | ICD-10-CM

## 2023-07-13 DIAGNOSIS — K219 Gastro-esophageal reflux disease without esophagitis: Secondary | ICD-10-CM

## 2023-07-14 ENCOUNTER — Encounter: Admit: 2023-07-14 | Discharge: 2023-07-14 | Payer: MEDICARE

## 2023-07-17 ENCOUNTER — Encounter: Admit: 2023-07-17 | Discharge: 2023-07-17 | Payer: MEDICARE

## 2023-07-17 DIAGNOSIS — K766 Portal hypertension: Secondary | ICD-10-CM

## 2023-07-17 DIAGNOSIS — K7581 Nonalcoholic steatohepatitis (NASH): Secondary | ICD-10-CM

## 2023-07-17 DIAGNOSIS — K7460 Unspecified cirrhosis of liver: Secondary | ICD-10-CM

## 2023-07-21 ENCOUNTER — Encounter: Admit: 2023-07-21 | Discharge: 2023-07-21 | Payer: MEDICARE

## 2023-07-21 NOTE — Telephone Encounter
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

## 2023-07-21 NOTE — Telephone Encounter
Spoke with pt. Pt reports she called [facility] in [place] to schedule bone density and they did not have the order. Bone density order re-faxed to radiology department. Pt verbalized understanding and did not have any other questions or concerns at this time.

## 2023-07-21 NOTE — Telephone Encounter
Pt called requesting her bone density order for hip and spine to fax to [facility] in [place]. She did not have the fax number. Please send order.

## 2023-07-21 NOTE — Telephone Encounter
Valerie Price called in needing a new diagnosis for bone density. 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 Please fax to [phone]

## 2023-09-28 ENCOUNTER — Encounter: Admit: 2023-09-28 | Discharge: 2023-09-28 | Payer: MEDICARE

## 2023-09-28 NOTE — Telephone Encounter
Spoke with pt. Pt asked if she could take Tylenol Arthritis for her knee pain. This NC recommended pt does not exceed 2,000mg  of tylenol daily. Tylenol Arthritis is 650mg  according to the Tylenol website. This NC instructed pt to only take up to 1 650mg  tablet 3 times a day or every 8 hours to not exceed the 2g maximum. Pt verbalized understanding and did not have any other questions or concerns at this time.

## 2023-09-28 NOTE — Telephone Encounter
Pt call routed to [facility]

## 2023-11-05 ENCOUNTER — Encounter: Admit: 2023-11-05 | Discharge: 2023-11-05 | Payer: MEDICARE

## 2023-11-19 ENCOUNTER — Encounter: Admit: 2023-11-19 | Discharge: 2023-11-19 | Payer: MEDICARE

## 2023-11-26 ENCOUNTER — Encounter: Admit: 2023-11-26 | Discharge: 2023-11-26 | Payer: MEDICARE

## 2023-12-10 ENCOUNTER — Encounter: Admit: 2023-12-10 | Discharge: 2023-12-10 | Payer: MEDICARE

## 2023-12-10 DIAGNOSIS — K7460 Unspecified cirrhosis of liver: Secondary | ICD-10-CM

## 2023-12-11 ENCOUNTER — Encounter: Admit: 2023-12-11 | Discharge: 2023-12-11 | Payer: MEDICARE

## 2023-12-14 ENCOUNTER — Encounter: Admit: 2023-12-14 | Discharge: 2023-12-14 | Payer: MEDICARE

## 2023-12-15 ENCOUNTER — Encounter: Admit: 2023-12-15 | Discharge: 2023-12-15 | Payer: MEDICARE

## 2023-12-16 ENCOUNTER — Encounter: Admit: 2023-12-16 | Discharge: 2023-12-16 | Payer: MEDICARE

## 2023-12-16 MED ORDER — ERGOCALCIFEROL (VITAMIN D2) 1,250 MCG (50,000 UNIT) PO CAP
50000 [IU] | ORAL_CAPSULE | ORAL | 0 refills | 56.00000 days | Status: AC
Start: 2023-12-16 — End: ?

## 2023-12-16 NOTE — Telephone Encounter
 Spoke with patient. Reviewed stable liver labs and explained Vitamin D deficiency. Explained rx for high-dose Vitamin D to be taken 3x weekly for 12 weeks. Confirmed pt's [facility]. Pt v/u.

## 2023-12-25 ENCOUNTER — Encounter: Admit: 2023-12-25 | Discharge: 2023-12-25

## 2023-12-29 ENCOUNTER — Encounter: Admit: 2023-12-29 | Discharge: 2023-12-29

## 2023-12-29 DIAGNOSIS — K7460 Unspecified cirrhosis of liver: Secondary | ICD-10-CM

## 2023-12-30 ENCOUNTER — Encounter: Admit: 2023-12-30 | Discharge: 2023-12-30

## 2023-12-30 NOTE — Telephone Encounter
 I spoke with patient and advised of CT and OV appt 07/01/2024. Patient wants CT at outside facility [facility] [place]. External order placed by NC and order faxed. [facility]ology will contact patient to schedule appt.

## 2023-12-30 NOTE — Telephone Encounter
 Pt returned missed call. Transferred call to [place].

## 2023-12-31 ENCOUNTER — Encounter: Admit: 2023-12-31 | Discharge: 2023-12-31

## 2023-12-31 DIAGNOSIS — K7581 Nonalcoholic steatohepatitis (NASH): Secondary | ICD-10-CM

## 2023-12-31 DIAGNOSIS — K7460 Unspecified cirrhosis of liver: Secondary | ICD-10-CM

## 2023-12-31 NOTE — Telephone Encounter
 Eileen Stanford called needing a lab order for BUN and Creatnin  to schedule pt for a CT scan since its with contrast .  Please fax to [phone]

## 2023-12-31 NOTE — Telephone Encounter
 NC returned call to [place]. NC notified Jenna CT is due Sept/Oct. Eileen Stanford requested lab orders to check creatinine prior to CT. NC will fax all lab orders to [facility] to be collected prior to CT. Eileen Stanford V/U.     Lab orders faxed to [facility] [phone].

## 2024-01-10 IMAGING — NM Imaging study
3 series · 6 of 6 positions shown · non-contrast
Comparison: none

[Series 1000: gastric statics 1hr · 3.90mm/px · 2 of 2 frames shown]
[frame 1/2]
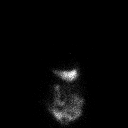
[frame 2/2]
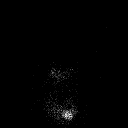

[Series 1000: gastric statics 0 · 3.90mm/px · 2 of 2 frames shown]
[frame 1/2]
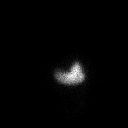
[frame 2/2]
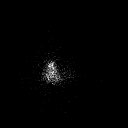

[Series 1000: gastric statics 2hr · 3.90mm/px · 2 of 2 frames shown]
[frame 1/2]
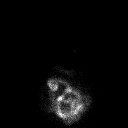
[frame 2/2]
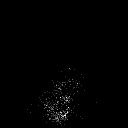

[6 of 6 positions shown; findings below may reference images not displayed]

EXAM

NM gastric emptying study

INDICATION

Nausea.

TECHNIQUE

1.1 mCi NcMMm Sulfur Colloid.  Pt ate 1 cup eggs, two toast with [place], 4 oz water

COMPARISONS

None available at the time of dictation.

FINDINGS

Percent gastric retention is as follows:

1 hour 29% retained (normal between 37%-90%)

2 hour 5% retained (normal between 30%-60%)

The patient was not taking any medication known to influence gastric emptying within 24 hours of
this exam. Normal passage of radiopharmaceutical is shown into the small intestine.

IMPRESSION
1. Rapid gastric emptying.

Tech Notes:

## 2024-03-11 ENCOUNTER — Encounter: Admit: 2024-03-11 | Discharge: 2024-03-11 | Payer: MEDICARE

## 2024-05-31 ENCOUNTER — Encounter: Admit: 2024-05-31 | Discharge: 2024-05-31 | Payer: MEDICARE

## 2024-05-31 NOTE — Telephone Encounter
 Patient stated that she would be getting labs and CT this month.

## 2024-05-31 NOTE — Telephone Encounter
 Pt called asking if [facility] has any hematologists near her that could draw lab for her. Advised pt that she may want to speak with hematology, but pt stated that she wanted a call back from her NC because she has questions about her upcoming CT scan as well. Please return call.

## 2024-05-31 NOTE — Telephone Encounter
 Pt returned missed call. Transferred call to [facility]

## 2024-05-31 NOTE — Telephone Encounter
 LVM with patient to call back and ask if imaging has been scheduled.

## 2024-06-14 ENCOUNTER — Encounter: Admit: 2024-06-14 | Discharge: 2024-06-14 | Payer: MEDICARE

## 2024-06-16 ENCOUNTER — Encounter: Admit: 2024-06-16 | Discharge: 2024-06-16 | Payer: MEDICARE

## 2024-06-17 ENCOUNTER — Encounter: Admit: 2024-06-17 | Discharge: 2024-06-17 | Payer: MEDICARE

## 2024-06-20 ENCOUNTER — Encounter: Admit: 2024-06-20 | Discharge: 2024-06-20 | Payer: MEDICARE

## 2024-06-21 ENCOUNTER — Encounter: Admit: 2024-06-21 | Discharge: 2024-06-21 | Payer: MEDICARE

## 2024-06-27 ENCOUNTER — Encounter: Admit: 2024-06-27 | Discharge: 2024-06-27 | Payer: MEDICARE

## 2024-06-27 NOTE — Telephone Encounter
 1st Attempt: Request sent to [facility] for abd CT report.

## 2024-06-30 ENCOUNTER — Encounter: Admit: 2024-06-30 | Discharge: 2024-06-30 | Payer: MEDICARE

## 2024-06-30 DIAGNOSIS — K7460 Unspecified cirrhosis of liver: Principal | ICD-10-CM

## 2024-06-30 DIAGNOSIS — K7581 Nonalcoholic steatohepatitis (NASH): Secondary | ICD-10-CM

## 2024-07-01 ENCOUNTER — Ambulatory Visit: Admit: 2024-07-01 | Discharge: 2024-07-02 | Payer: MEDICARE

## 2024-07-01 ENCOUNTER — Encounter: Admit: 2024-07-01 | Discharge: 2024-07-01 | Payer: MEDICARE

## 2024-07-07 ENCOUNTER — Encounter: Admit: 2024-07-07 | Discharge: 2024-07-07 | Payer: MEDICARE

## 2024-08-17 ENCOUNTER — Encounter: Admit: 2024-08-17 | Discharge: 2024-08-17 | Payer: MEDICARE
